# Patient Record
Sex: Female | Born: 1965 | Race: White | Hispanic: No | Smoking: Never smoker
Health system: Southern US, Community
[De-identification: ages and names within clinical notes are randomized; demographics above are authoritative.]

## PROBLEM LIST (undated history)

## (undated) DIAGNOSIS — Z9889 Other specified postprocedural states: Secondary | ICD-10-CM

## (undated) DIAGNOSIS — R112 Nausea with vomiting, unspecified: Secondary | ICD-10-CM

## (undated) DIAGNOSIS — M1990 Unspecified osteoarthritis, unspecified site: Secondary | ICD-10-CM

## (undated) DIAGNOSIS — K635 Polyp of colon: Secondary | ICD-10-CM

## (undated) DIAGNOSIS — C801 Malignant (primary) neoplasm, unspecified: Secondary | ICD-10-CM

## (undated) DIAGNOSIS — G43829 Menstrual migraine, not intractable, without status migrainosus: Secondary | ICD-10-CM

## (undated) DIAGNOSIS — R011 Cardiac murmur, unspecified: Secondary | ICD-10-CM

## (undated) DIAGNOSIS — F988 Other specified behavioral and emotional disorders with onset usually occurring in childhood and adolescence: Secondary | ICD-10-CM

## (undated) DIAGNOSIS — T7840XA Allergy, unspecified, initial encounter: Secondary | ICD-10-CM

## (undated) DIAGNOSIS — F32A Depression, unspecified: Secondary | ICD-10-CM

## (undated) DIAGNOSIS — F419 Anxiety disorder, unspecified: Secondary | ICD-10-CM

## (undated) HISTORY — DX: Cardiac murmur, unspecified: R01.1

## (undated) HISTORY — DX: Malignant (primary) neoplasm, unspecified: C80.1

## (undated) HISTORY — DX: Depression, unspecified: F32.A

## (undated) HISTORY — DX: Unspecified osteoarthritis, unspecified site: M19.90

## (undated) HISTORY — PX: APPENDECTOMY: SHX54

## (undated) HISTORY — PX: HERNIA REPAIR: SHX51

## (undated) HISTORY — DX: Menstrual migraine, not intractable, without status migrainosus: G43.829

## (undated) HISTORY — DX: Anxiety disorder, unspecified: F41.9

## (undated) HISTORY — DX: Allergy, unspecified, initial encounter: T78.40XA

## (undated) HISTORY — DX: Other specified behavioral and emotional disorders with onset usually occurring in childhood and adolescence: F98.8

## (undated) HISTORY — PX: UMBILICAL HERNIA REPAIR: SHX196

## (undated) HISTORY — PX: BREAST LUMPECTOMY: SHX2

## (undated) HISTORY — DX: Polyp of colon: K63.5

---

## 1998-11-01 ENCOUNTER — Other Ambulatory Visit: Admission: RE | Admit: 1998-11-01 | Discharge: 1998-11-01 | Payer: Self-pay | Admitting: Gynecology

## 1999-11-03 ENCOUNTER — Other Ambulatory Visit: Admission: RE | Admit: 1999-11-03 | Discharge: 1999-11-03 | Payer: Self-pay | Admitting: Gynecology

## 2001-01-14 ENCOUNTER — Encounter (INDEPENDENT_AMBULATORY_CARE_PROVIDER_SITE_OTHER): Payer: Self-pay | Admitting: Specialist

## 2001-01-14 ENCOUNTER — Ambulatory Visit (HOSPITAL_COMMUNITY): Admission: RE | Admit: 2001-01-14 | Discharge: 2001-01-14 | Payer: Self-pay | Admitting: General Surgery

## 2001-02-17 ENCOUNTER — Other Ambulatory Visit: Admission: RE | Admit: 2001-02-17 | Discharge: 2001-02-17 | Payer: Self-pay | Admitting: Gynecology

## 2001-04-27 ENCOUNTER — Encounter (INDEPENDENT_AMBULATORY_CARE_PROVIDER_SITE_OTHER): Payer: Self-pay

## 2001-04-27 ENCOUNTER — Other Ambulatory Visit: Admission: RE | Admit: 2001-04-27 | Discharge: 2001-04-27 | Payer: Self-pay | Admitting: Gynecology

## 2001-07-15 ENCOUNTER — Ambulatory Visit (HOSPITAL_COMMUNITY): Admission: RE | Admit: 2001-07-15 | Discharge: 2001-07-15 | Payer: Self-pay | Admitting: Gynecology

## 2001-07-15 ENCOUNTER — Encounter (INDEPENDENT_AMBULATORY_CARE_PROVIDER_SITE_OTHER): Payer: Self-pay

## 2001-07-24 HISTORY — PX: OTHER SURGICAL HISTORY: SHX169

## 2002-04-11 ENCOUNTER — Other Ambulatory Visit: Admission: RE | Admit: 2002-04-11 | Discharge: 2002-04-11 | Payer: Self-pay | Admitting: Gynecology

## 2003-04-20 ENCOUNTER — Other Ambulatory Visit: Admission: RE | Admit: 2003-04-20 | Discharge: 2003-04-20 | Payer: Self-pay | Admitting: Gynecology

## 2004-05-29 ENCOUNTER — Other Ambulatory Visit: Admission: RE | Admit: 2004-05-29 | Discharge: 2004-05-29 | Payer: Self-pay | Admitting: Gynecology

## 2004-08-22 ENCOUNTER — Encounter: Admission: RE | Admit: 2004-08-22 | Discharge: 2004-08-22 | Payer: Self-pay | Admitting: Gynecology

## 2005-04-01 ENCOUNTER — Ambulatory Visit: Payer: Self-pay | Admitting: Licensed Clinical Social Worker

## 2005-04-10 ENCOUNTER — Ambulatory Visit: Payer: Self-pay | Admitting: Licensed Clinical Social Worker

## 2005-04-17 ENCOUNTER — Ambulatory Visit: Payer: Self-pay | Admitting: Licensed Clinical Social Worker

## 2005-04-24 ENCOUNTER — Ambulatory Visit: Payer: Self-pay | Admitting: Licensed Clinical Social Worker

## 2005-05-01 ENCOUNTER — Ambulatory Visit: Payer: Self-pay | Admitting: Licensed Clinical Social Worker

## 2005-05-15 ENCOUNTER — Ambulatory Visit: Payer: Self-pay | Admitting: Licensed Clinical Social Worker

## 2005-05-22 ENCOUNTER — Ambulatory Visit: Payer: Self-pay | Admitting: Licensed Clinical Social Worker

## 2005-06-02 ENCOUNTER — Other Ambulatory Visit: Admission: RE | Admit: 2005-06-02 | Discharge: 2005-06-02 | Payer: Self-pay | Admitting: Gynecology

## 2005-06-12 ENCOUNTER — Ambulatory Visit: Payer: Self-pay | Admitting: Licensed Clinical Social Worker

## 2005-06-19 ENCOUNTER — Ambulatory Visit: Payer: Self-pay | Admitting: Licensed Clinical Social Worker

## 2005-06-26 ENCOUNTER — Ambulatory Visit: Payer: Self-pay | Admitting: Licensed Clinical Social Worker

## 2005-07-17 ENCOUNTER — Ambulatory Visit: Payer: Self-pay | Admitting: Licensed Clinical Social Worker

## 2005-07-24 ENCOUNTER — Ambulatory Visit: Payer: Self-pay | Admitting: Licensed Clinical Social Worker

## 2005-07-31 ENCOUNTER — Ambulatory Visit: Payer: Self-pay | Admitting: Licensed Clinical Social Worker

## 2005-08-07 ENCOUNTER — Ambulatory Visit: Payer: Self-pay | Admitting: Licensed Clinical Social Worker

## 2005-08-14 ENCOUNTER — Ambulatory Visit: Payer: Self-pay | Admitting: Licensed Clinical Social Worker

## 2005-08-21 ENCOUNTER — Ambulatory Visit: Payer: Self-pay | Admitting: Licensed Clinical Social Worker

## 2005-08-28 ENCOUNTER — Ambulatory Visit: Payer: Self-pay | Admitting: Licensed Clinical Social Worker

## 2005-09-02 ENCOUNTER — Ambulatory Visit: Payer: Self-pay | Admitting: Licensed Clinical Social Worker

## 2005-09-18 ENCOUNTER — Ambulatory Visit: Payer: Self-pay | Admitting: Licensed Clinical Social Worker

## 2005-10-09 ENCOUNTER — Ambulatory Visit: Payer: Self-pay | Admitting: Licensed Clinical Social Worker

## 2005-10-23 ENCOUNTER — Ambulatory Visit: Payer: Self-pay | Admitting: Licensed Clinical Social Worker

## 2005-11-06 ENCOUNTER — Ambulatory Visit: Payer: Self-pay | Admitting: Licensed Clinical Social Worker

## 2006-08-31 ENCOUNTER — Other Ambulatory Visit: Admission: RE | Admit: 2006-08-31 | Discharge: 2006-08-31 | Payer: Self-pay | Admitting: Gynecology

## 2008-05-14 ENCOUNTER — Other Ambulatory Visit: Admission: RE | Admit: 2008-05-14 | Discharge: 2008-05-14 | Payer: Self-pay | Admitting: Gynecology

## 2010-06-09 ENCOUNTER — Other Ambulatory Visit: Admission: RE | Admit: 2010-06-09 | Discharge: 2010-06-09 | Payer: Self-pay | Admitting: Gynecology

## 2010-06-09 ENCOUNTER — Ambulatory Visit: Payer: Self-pay | Admitting: Gynecology

## 2010-08-05 ENCOUNTER — Ambulatory Visit: Payer: Self-pay | Admitting: Gynecology

## 2010-12-14 ENCOUNTER — Encounter: Payer: Self-pay | Admitting: Gynecology

## 2011-04-10 NOTE — H&P (Signed)
[facility]  Patient:    Renee Ochoa, Renee Ochoa Visit Number: [id number] MRN: [id number]          Service Type: Attending:  Gaetano Hawthorne. Lily Peer, M.D. Adm. Date:  07/15/01                           History and Physical  SCHEDULED FOR SURGERY:  7:30 a.m. [facility].  BRIEF HISTORY:  The patient is a 45 year old gravida 2, para 2, who was seen in the office several months ago at the time of her annual exam back on February 17, 2001, at which time she was complaining of hirsutism and she was evaluated with the possibility of late onset adrenal hyperplasia.  She had 17 hydroxyprogesterone, DHEA ______ testosterone which were all normal.  An ultrasound had been done to evaluate her ovaries in the event that was potential for a cyst and that could be producing some of the testosterone and causing some of these facial and extremity hair that she had been complaining of.   On May of this year, her ultrasound had a small echo-free cyst measuring 16 x 15 x 16 mm on the right ovary, the left ovary was normal.  The cul-de-sac was negative and the uterus was normal size.  She returned back and had a repeat scan on April 26, 2001, at which time there was a questionable echogenic focus in side the intrauterine cavity and we proceeded with a sonohistogram.  Her right ovarian cyst had completely resolved and now she had a small 15 x 13 simple echo-free cyst on the left.  The sonohistogram had demonstrated 2 echogenic defects measuring 10 x 7 mm and 5 x 3 mm on the posterior wall consistent with possible endometrial polyp.  She had an endometrial biopsy on April 26, 2001, which demonstrated a benign mixed endocervical/endometrial type polyp that had mixed with proliferative endometrium associated with focal degenerative changes consistent with anovulatory bleeding.  Her husband also has had a previous vasectomy.  The patient was given treatment option and the  recommendation was to proceed with resectascopic polypectomy and an endometrial ablation which she is content with.  PAST MEDICAL HISTORY:  She has had a umbilical herniorrhaphy in the past.  She has had a right lumpectomy and it was benign.  MEDICATIONS: 1. Claritin. 2. Benadryl. 3. Tylenol p.r.n.  She has had a history of juvenile arthritis.  She has had 2 normal spontaneous vaginal deliveries.  She has had gestational diabetes during her pregnancy.  FAMILY HISTORY:  Significant for the fact that her father had hypertension as well as her grandfather and her grandmother had breast cancer.  The patients recent Pap smear in March of this year was normal.  PHYSICAL EXAMINATION:  HEENT:  Unremarkable.  NECK:  Supple.  Trachea midline.  No carotid bruits.  No thyromegaly.  LUNGS:  Clear to auscultation without rhonchis or wheezes.  HEART:  Regular rate and rhythm, no murmurs or gallops.  BREAST:  Done at time of her annual exam was essentially unremarkable.  ABDOMEN:  Soft, nontender without rebound or guarding.  PELVIC:  Bartholin, Skene, urethra was within normal limits.  VAGINA AND CERVIX:  No lesion or discharge.  Uterus anteverted.  Normal size, shape and consistency.  ADNEXA:  Without masses or tenderness.  RECTAL EXAM:  Unremarkable.  ASSESSMENT: 18. A 45 year old gravida 2, para 2 with history of menorrhagia and a endometrial polyps recently found on sonohistogram  and also on endometrial biopsy.  Would like to proceed with definitive surgery but not as an invasive operation such as a hysterectomy.  She had received Lupron 11.25 mg IM on May 03, 2001, and is no longer bleeding.  We will proceed with a resectascopic polypectomy and endometrial ablation.  She received Laminaria that was placed transcervically today in the office in an effort to facilitate the insertion of the operative hysteroscope tomorrow in the operating room.  The risks, benefits, pros and  cons of the operation to include infection, bleeding, fluid overload, trauma to nearby organs were discussed with the patient and all questions were answered.  She will receive a prophylactic antibiotic as well. She was given a prescription for Lortab 7.5/500 1 p.o. q.4-6h. p.r.n. pain. All questions were answered and will follow accordingly.  PLAN: The patient is scheduled for resectascopic polypectomy/endometrial ablation tomorrow July 15, 2001, at [facility]. Attending:  Gaetano Hawthorne. Lily Peer, M.D. DD:  07/14/01 TD:  07/14/01 Job: [id number]

## 2011-04-10 NOTE — Op Note (Signed)
[facility]  Patient:    Renee Ochoa, Renee Ochoa                      MRN: [id number] Proc. Date: 01/14/01 Adm. Date:  [id number] Disc. Date: [id number] Attending:  Carson Myrtle                           Operative Report  PREOPERATIVE DIAGNOSIS:  Mass, right breast.  POSTOPERATIVE DIAGNOSIS:  Mass, right breast.  PROCEDURE:  Excisional biopsy of mass in right breast.  SURGEON:  Timothy E. Earlene Plater, M.D.  ANESTHESIA:  Local standby.  HISTORY:  [name] is a 45 year old Caucasian female with a strong family history of carcinoma of the breast and a new, slightly painful mass in the lateral right breast.  She wishes to have an excisional biopsy, as compared to other procedures.  She and I have located the mass and marked it preoperatively.  DESCRIPTION OF PROCEDURE:  The patient was taken to the operating room and placed supine, right chest elevated, arms outstretched and carefully padded. IV sedation given.  The mass was palpable at the lateral edge of the right breast, in approximately the mid axillary line; it was palpable.  The area was prepped and draped in the usual fashion.  Marcaine 0.25% with epinephrine was used throughout for local anesthesia.  A curvilinear incision made in the skin crease over the mass, the subcutaneous tissue dissected; the mass was seen and then grasped, and bluntly dissected from the surrounding tissue.  It had the appearance of either a benign lymph node or fibroadenoma.  It was submitted routinely for pathology.  Bleeding was carefully controlled and wound was dry. It was approximated deep with 3-0 Vicryl and the skin with 4-0 Monocryl. Steri-Strips were applied.  Counts were correct.  She tolerated it well and was removed to recovery room in good condition.  DISPOSITION:  Written and verbal instructions were given to the patient and her husband.  She will be seen and followed as an outpatient. DD:  01/14/01 TD:   01/17/01 Job: [id number]

## 2011-04-10 NOTE — Op Note (Signed)
[facility]  Patient:    Renee Ochoa, Renee Ochoa Visit Number: [id number] MRN: [id number]          Service Type: DSU Location: [facility] Attending Physician:  Tonye Royalty Proc. Date: 07/15/01 Adm. Date:  07/15/2001 Disc. Date: 07/15/2001                             Operative Report  INDICATIONS:                  A 45 year old gravida 2, para 2 with menomenorrhagia.  Workup has consisted of a sonohysterogram which demonstrated the patient had endometrial polyps.  Endometrial biopsy had demonstrated benign endometrial polyps and benign endometrium.  Patient previously primed with Lupron 11.25 mg IM.  Patients husband with previous vasectomy.  Patient with two children in the past and wanted to proceed with resectoscopic polypectomy and endometrial ablation.  PREOPERATIVE DIAGNOSES:       1. Menomenorrhagia.                               2. Endometrial polyps.  POSTOPERATIVE DIAGNOSES:      1. Menomenorrhagia.                               2. Endometrial polyps.  PROCEDURE:                    ______.  SURGEON:                      Juan H. Lily Peer, M.D.  ANESTHESIA:                   General endotracheal.  PROCEDURE:                    1. Resectoscopic polypectomy.                               2. Endomyo resection.                               3. Endometrial ablation.  FINDINGS:                     Endometrial polyp noted near the left tubal ostia.  The rest of the uterine cavity and the cervical canal were clear.  DESCRIPTION OF PROCEDURE:     After the patient was adequately counseled she was taken to the operating room where she underwent a successful general endotracheal anesthesia.  She was placed in the low lithotomy position.  She had received 1 g Cefotan prophylactically before the procedure.  Her bladder was evacuated of its contents of approximately 50-75 cc.  Examination under anesthesia confirmed upper limits of normal,  slightly anteverted uterus with no palpable adnexal masses.  The previously placed ______ tent that was placed in the office the day before was removed requiring minimal cervical dilatation with the [facility] dilator.  The operative resectoscope with a 90% degree wire loop was utilized and the [facility] generator was set at 80 watts in the cutting mode, 80 watts on the coagulation mode. The distending media was chilled 3% sorbitol.  The  operative resectoscope was inserted into the interior uterine cavity and the polyp with the stalk coming off near the left tubal ostia was resected and passed off the operative field. Then the entire intrauterine cavity was resected down to the level of the myometrium and the strips of tissue were submitted for histological evaluation.  The operative element was switched from a 90 degree wire loop to a loller bar in which the entire endometrial cavity was then ablated to the level of the internal cervical os.  The procedure was tolerated well by the patient.  Fluid deficit from the 3% distending media of the sorbitol was 300 cc and IV fluids were 1300 cc of lactated Ringers.  Patient was extubated, transferred to recovery room with stable vital signs.  Pictures were taken before and after the procedure.  Will be made part of the patients hospital as well as the office permanent record.  Patient received 30 mg of Toradol en route to the recovery room as well as 4 mg of Zofran for nausea IV. Attending Physician:  Tonye Royalty DD:  07/15/01 TD:  07/17/01 Job: [id number]

## 2012-05-03 ENCOUNTER — Other Ambulatory Visit: Payer: Self-pay | Admitting: *Deleted

## 2012-05-03 DIAGNOSIS — R928 Other abnormal and inconclusive findings on diagnostic imaging of breast: Secondary | ICD-10-CM

## 2012-05-04 ENCOUNTER — Other Ambulatory Visit: Payer: Self-pay | Admitting: Gynecology

## 2012-05-04 DIAGNOSIS — R928 Other abnormal and inconclusive findings on diagnostic imaging of breast: Secondary | ICD-10-CM

## 2012-05-09 ENCOUNTER — Encounter: Payer: Self-pay | Admitting: Gynecology

## 2013-02-27 ENCOUNTER — Encounter: Payer: Self-pay | Admitting: Gynecology

## 2013-02-27 ENCOUNTER — Ambulatory Visit (INDEPENDENT_AMBULATORY_CARE_PROVIDER_SITE_OTHER): Payer: Managed Care, Other (non HMO) | Admitting: Gynecology

## 2013-02-27 ENCOUNTER — Other Ambulatory Visit (HOSPITAL_COMMUNITY)
Admission: RE | Admit: 2013-02-27 | Discharge: 2013-02-27 | Disposition: A | Discharge status: Home / Self Care | Payer: 59 | Source: Ambulatory Visit | Attending: Gynecology | Admitting: Gynecology

## 2013-02-27 VITALS — BP 128/70 | Ht 60.75 in | Wt 129.0 lb

## 2013-02-27 DIAGNOSIS — Z1151 Encounter for screening for human papillomavirus (HPV): Secondary | ICD-10-CM | POA: Insufficient documentation

## 2013-02-27 DIAGNOSIS — Z01419 Encounter for gynecological examination (general) (routine) without abnormal findings: Secondary | ICD-10-CM | POA: Insufficient documentation

## 2013-02-27 NOTE — Patient Instructions (Addendum)
Breast Self-Awareness  Practicing breast self-awareness may pick up problems early, prevent significant medical complications, and possibly save your life. By practicing breast self-awareness, you can become familiar with how your breasts look and feel and if your breasts are changing. This allows you to notice changes early. It can also offer you some reassurance that your breast health is good. One way to learn what is normal for your breasts and whether your breasts are changing is to do a breast self-exam.  If you find a lump or something that was not present in the past, it is best to contact your caregiver right away. Other findings that should be evaluated by your caregiver include nipple discharge, especially if it is bloody; skin changes or reddening; areas where the skin seems to be pulled in (retracted); or new lumps and bumps. Breast pain is seldom associated with cancer (malignancy), but should also be evaluated by a caregiver.  BREAST SELF-EXAM  The best time to examine your breasts is 5 7 days after your menstrual period is over. During menstruation, the breasts are lumpier, and it may be more difficult to pick up changes. If you do not menstruate, have reached menopause, or had your uterus removed (hysterectomy), you should examine your breasts at regular intervals, such as monthly. If you are breastfeeding, examine your breasts after a feeding or after using a breast pump. Breast implants do not decrease the risk for lumps or tumors, so continue to perform breast self-exams as recommended. Talk to your caregiver about how to determine the difference between the implant and breast tissue. Also, talk about the amount of pressure you should use during the exam. Over time, you will become more familiar with the variations of your breasts and more comfortable with the exam. A breast self-exam requires you to remove all your clothes above the waist.    Look at your breasts and nipples. Stand in front of  a mirror in a room with good lighting. With your hands on your hips, push your hands firmly downward. Look for a difference in shape, contour, and size from one breast to the other (asymmetry). Asymmetry includes puckers, dips, or bumps. Also, look for skin changes, such as reddened or scaly areas on the breasts. Look for nipple changes, such as discharge, dimpling, repositioning, or redness.   Carefully feel your breasts. This is best done either in the shower or tub while using soapy water or when flat on your back. Place the arm (on the side of the breast you are examining) above your head. Use the pads (not the fingertips) of your three middle fingers on your opposite hand to feel your breasts. Start in the underarm area and use  inch (2 cm) overlapping circles to feel your breast. Use 3 different levels of pressure (light, medium, and firm pressure) at each circle before moving to the next circle. The light pressure is needed to feel the tissue closest to the skin. The medium pressure will help to feel breast tissue a little deeper, while the firm pressure is needed to feel the tissue close to the ribs. Continue the overlapping circles, moving downward over the breast until you feel your ribs below your breast. Then, move one finger-width towards the center of the body. Continue to use the  inch (2 cm) overlapping circles to feel your breast as you move slowly up toward the collar bone (clavicle) near the base of the neck. Continue the up and down exam using all 3 pressures   until you reach the middle of the chest. Do this with each breast, carefully feeling for lumps or changes.   Keep a written record with breast changes or normal findings for each breast. By writing this information down, you do not need to depend only on memory for size, tenderness, or location. Write down where you are in your menstrual cycle, if you are still menstruating.   Breast tissue can have some lumps or thick tissue. However,  see your caregiver if you find anything that concerns you.   SEEK MEDICAL CARE IF:   You see a change in shape, contour, or size of your breasts or nipples.    You see skin changes, such as reddened or scaly areas on the breasts or nipples.    You have an unusual discharge from your nipples.    You feel a new lump or unusually thick areas.   Document Released: 11/09/2005 Document Revised: 05/10/2012 Document Reviewed: 02/24/2012  Exit[employer] Patient Information 2013 [employer].

## 2013-02-28 ENCOUNTER — Encounter: Payer: Self-pay | Admitting: Gynecology

## 2013-02-28 LAB — CBC WITH DIFFERENTIAL/PLATELET
Eosinophils Absolute: 0.4 10*3/uL (ref 0.0–0.7)
Eosinophils Relative: 5 % (ref 0–5)
HCT: 39.4 % (ref 36.0–46.0)
Lymphocytes Relative: 33 % (ref 12–46)
Lymphs Abs: 2.8 10*3/uL (ref 0.7–4.0)
MCH: 29.1 pg (ref 26.0–34.0)
MCV: 84.2 fL (ref 78.0–100.0)
Monocytes Absolute: 1 10*3/uL (ref 0.1–1.0)
Monocytes Relative: 12 % (ref 3–12)
Platelets: 272 10*3/uL (ref 150–400)
RBC: 4.68 MIL/uL (ref 3.87–5.11)

## 2013-02-28 LAB — URINALYSIS W MICROSCOPIC + REFLEX CULTURE
Casts: NONE SEEN
Crystals: NONE SEEN
Glucose, UA: NEGATIVE mg/dL
Leukocytes, UA: NEGATIVE
Specific Gravity, Urine: 1.009 (ref 1.005–1.030)
Squamous Epithelial / LPF: NONE SEEN
pH: 6.5 (ref 5.0–8.0)

## 2013-02-28 NOTE — Progress Notes (Addendum)
Renee Ochoa [birth year] [id number]   History:    47 y.o.  for annual gyn exam with no complaints today. Review of patient's records indicated that back in 1993 she had an abnormal Pap smear with slight dysplasia and had cryotherapy and since then her Pap smears have been normal. Patient with prior history of resectoscopic polypectomy as well as endometrial ablation back in 1998 patient cycles of been regular. 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 She states her Tdap vaccine are up-to-date. Patient divorced and not sexually active. Prior to endometrial ablation since she had been married at that time her husband had a vasectomy. Patient has had history of gestational diabetes in the past.  Patient was evaluated in 2012 by cardiologist Dr. Herbie Baltimore for possible mitral valve prolapse and past history of palpitations. He has stated that she had a reassuring echocardiogram without any real significant mitral valve prolapse. He describes her as having a mild murmur, mild regurgitation and recommended a followup echocardiogram in 3-4 years to make sure that there was no progression.  Patient had a right breast biopsy in 2002 by Lorelee New M.D. Pathology report came back a lymph node benign. Grossly and and and a and and and a the the a the  Past medical history,surgical history, family history and social history were all reviewed and documented in the EPIC chart.  Gynecologic History Patient's last menstrual period was 02/06/2013. Contraception: none Last Pap: 2011. Results were: normal Last mammogram: 2013. Results were: normal  Obstetric History OB History   Grav Para Term Preterm Abortions TAB SAB Ect Mult Living   2 2        2      # Outc Date GA Lbr Len/2nd Wgt Sex Del Anes PTL Lv   1 PAR            2 PAR                ROS: A ROS was performed and pertinent positives and negatives are included in the history.  GENERAL: No  fevers or chills. HEENT: No change in vision, no earache, sore throat or sinus congestion. NECK: No pain or stiffness. CARDIOVASCULAR: No chest pain or pressure. No palpitations. PULMONARY: No shortness of breath, cough or wheeze. GASTROINTESTINAL: No abdominal pain, nausea, vomiting or diarrhea, melena or bright red blood per rectum. GENITOURINARY: No urinary frequency, urgency, hesitancy or dysuria. MUSCULOSKELETAL: No joint or muscle pain, no back pain, no recent trauma. DERMATOLOGIC: No rash, no itching, no lesions. ENDOCRINE: No polyuria, polydipsia, no heat or cold intolerance. No recent change in weight. HEMATOLOGICAL: No anemia or easy bruising or bleeding. NEUROLOGIC: No headache, seizures, numbness, tingling or weakness. PSYCHIATRIC: No depression, no loss of interest in normal activity or change in sleep pattern.     Exam: chaperone present  BP 128/70  Ht 5' 0.75" (1.543 m)  Wt 129 lb (58.514 kg)  BMI 24.58 kg/m2  LMP 02/06/2013  Body mass index is 24.58 kg/(m^2).  General appearance : Well developed well nourished female. No acute distress HEENT: Neck supple, trachea midline, no carotid bruits, no thyroidmegaly Lungs: Clear to auscultation, no rhonchi or wheezes, or rib retractions  Heart: Regular rate and rhythm, no murmurs or gallops Breast:Examined in sitting and supine position were symmetrical in appearance, no palpable masses or tenderness,  no skin retraction, no nipple inversion, no nipple discharge, no skin discoloration, no axillary or supraclavicular lymphadenopathy Abdomen: no palpable  masses or tenderness, no rebound or guarding Extremities: no edema or skin discoloration or tenderness  Pelvic:  Bartholin, Urethra, Skene Glands: Within normal limits             Vagina: No gross lesions or discharge  Cervix: No gross lesions or discharge  Uterus  anteverted, normal size, shape and consistency, non-tender and mobile  Adnexa  Without masses or tenderness  Anus and  perineum  normal   Rectovaginal  normal sphincter tone without palpated masses or tenderness             Hemoccult not indicated     Assessment/Plan:  47 y.o. female for annual exam with no complaints today. Discussed importance of monthly self breast examination. We also discussed importance of calcium vitamin D and regular exercise for osteoporosis prevention. Patient will be scheduling her mammogram for June of this year. Her Pap smear was done today. The new guidelines were discussed today. The following labs were ordered: CBC, hemoglobin A1c, screening cholesterol, as well as urinalysis.    Ok Edwards MD, 7:49 AM 02/28/2013

## 2013-03-15 ENCOUNTER — Encounter: Payer: Self-pay | Admitting: Gynecology

## 2013-05-23 ENCOUNTER — Encounter: Payer: Self-pay | Admitting: Gynecology

## 2013-09-28 ENCOUNTER — Other Ambulatory Visit: Payer: Self-pay

## 2013-10-22 ENCOUNTER — Encounter (HOSPITAL_COMMUNITY): Payer: Self-pay | Admitting: Emergency Medicine

## 2013-10-22 ENCOUNTER — Emergency Department (HOSPITAL_COMMUNITY): Payer: Managed Care, Other (non HMO)

## 2013-10-22 ENCOUNTER — Emergency Department (HOSPITAL_COMMUNITY)
Admission: EM | Admit: 2013-10-22 | Discharge: 2013-10-22 | Disposition: A | Discharge status: Nursing Home (Includes ICF) | Payer: 59 | Source: Home / Self Care | Attending: Family Medicine | Admitting: Family Medicine

## 2013-10-22 ENCOUNTER — Observation Stay (HOSPITAL_COMMUNITY)
Admission: EM | Admit: 2013-10-22 | Discharge: 2013-10-24 | Disposition: A | Discharge status: Home / Self Care | Payer: Managed Care, Other (non HMO) | Attending: General Surgery | Admitting: General Surgery

## 2013-10-22 DIAGNOSIS — K37 Unspecified appendicitis: Secondary | ICD-10-CM | POA: Diagnosis present

## 2013-10-22 DIAGNOSIS — K3580 Unspecified acute appendicitis: Principal | ICD-10-CM | POA: Insufficient documentation

## 2013-10-22 DIAGNOSIS — D251 Intramural leiomyoma of uterus: Secondary | ICD-10-CM | POA: Insufficient documentation

## 2013-10-22 DIAGNOSIS — R51 Headache: Secondary | ICD-10-CM | POA: Insufficient documentation

## 2013-10-22 DIAGNOSIS — R52 Pain, unspecified: Secondary | ICD-10-CM

## 2013-10-22 DIAGNOSIS — R1031 Right lower quadrant pain: Secondary | ICD-10-CM

## 2013-10-22 HISTORY — DX: Other specified postprocedural states: Z98.890

## 2013-10-22 HISTORY — DX: Nausea with vomiting, unspecified: R11.2

## 2013-10-22 LAB — WET PREP, GENITAL
Clue Cells Wet Prep HPF POC: NONE SEEN
Trich, Wet Prep: NONE SEEN
Yeast Wet Prep HPF POC: NONE SEEN

## 2013-10-22 LAB — POCT URINALYSIS DIP (DEVICE)
Bilirubin Urine: NEGATIVE
Hgb urine dipstick: NEGATIVE
Ketones, ur: NEGATIVE mg/dL
Leukocytes, UA: NEGATIVE
Protein, ur: NEGATIVE mg/dL
Specific Gravity, Urine: 1.01 (ref 1.005–1.030)
pH: 7 (ref 5.0–8.0)

## 2013-10-22 LAB — COMPREHENSIVE METABOLIC PANEL
AST: 16 U/L (ref 0–37)
Albumin: 3.7 g/dL (ref 3.5–5.2)
Alkaline Phosphatase: 50 U/L (ref 39–117)
BUN: 6 mg/dL (ref 6–23)
Chloride: 102 mEq/L (ref 96–112)
Creatinine, Ser: 0.6 mg/dL (ref 0.50–1.10)
GFR calc Af Amer: >90 mL/min (ref >90)
GFR calc non Af Amer: >90 mL/min (ref >90)
Glucose, Bld: 94 mg/dL (ref 70–99)
Potassium: 4 mEq/L (ref 3.5–5.1)
Total Bilirubin: 0.4 mg/dL (ref 0.3–1.2)

## 2013-10-22 LAB — CBC WITH DIFFERENTIAL/PLATELET
Basophils Absolute: 0 10*3/uL (ref 0.0–0.1)
Eosinophils Relative: 4 % (ref 0–5)
Lymphocytes Relative: 17 % (ref 12–46)
Lymphs Abs: 2 10*3/uL (ref 0.7–4.0)
MCV: 84.4 fL (ref 78.0–100.0)
Neutro Abs: 8.1 10*3/uL — ABNORMAL HIGH (ref 1.7–7.7)
Neutrophils Relative %: 70 % (ref 43–77)
Platelets: 249 10*3/uL (ref 150–400)
RBC: 4.81 MIL/uL (ref 3.87–5.11)
RDW: 13.1 % (ref 11.5–15.5)
WBC: 11.7 10*3/uL — ABNORMAL HIGH (ref 4.0–10.5)

## 2013-10-22 LAB — POCT PREGNANCY, URINE: Preg Test, Ur: NEGATIVE

## 2013-10-22 MED ORDER — IOHEXOL 300 MG/ML  SOLN
20.0000 mL | INTRAMUSCULAR | Status: AC
  Administered 2013-10-22: 25 mL via ORAL

## 2013-10-22 MED ORDER — ONDANSETRON HCL 4 MG/2ML IJ SOLN
4.0000 mg | Freq: Once | INTRAMUSCULAR | Status: DC
  Administered 2013-10-23: 4 mg via INTRAVENOUS

## 2013-10-22 MED ORDER — ONDANSETRON HCL 4 MG/2ML IJ SOLN: 4.0000 mg | Freq: Four times a day (QID) | INTRAMUSCULAR | Status: DC | PRN

## 2013-10-22 MED ORDER — SODIUM CHLORIDE 0.9 % IV SOLN
INTRAVENOUS | Status: DC
  Administered 2013-10-22: 23:00:00 via INTRAVENOUS

## 2013-10-22 MED ORDER — IOHEXOL 300 MG/ML  SOLN
100.0000 mL | Freq: Once | INTRAMUSCULAR | Status: AC | PRN
  Administered 2013-10-22: 100 mL via INTRAVENOUS

## 2013-10-22 MED ORDER — MORPHINE SULFATE 2 MG/ML IJ SOLN: 2.0000 mg | INTRAMUSCULAR | Status: DC | PRN

## 2013-10-22 MED ORDER — SODIUM CHLORIDE 0.9 % IV SOLN
1.0000 g | INTRAVENOUS | Status: DC
  Administered 2013-10-22: 1 g via INTRAVENOUS
  Filled 2013-10-22: qty 1

## 2013-10-22 MED ORDER — MORPHINE SULFATE 4 MG/ML IJ SOLN: 4.0000 mg | Freq: Once | INTRAMUSCULAR | Status: DC

## 2013-10-22 MED ORDER — IBUPROFEN 800 MG PO TABS
800.0000 mg | ORAL_TABLET | Freq: Once | ORAL | Status: AC
  Administered 2013-10-22: 800 mg via ORAL
  Filled 2013-10-22: qty 1

## 2013-10-22 NOTE — ED Notes (Signed)
Ct notified pt completed contrast.  

## 2013-10-22 NOTE — ED Notes (Signed)
PT requesting PO pain meds . Instead of IV meds.

## 2013-10-22 NOTE — ED Notes (Signed)
Pt reports RLQ and pelvic pain since Saturday. She went to [facility] today and they sent to ed for further workup. Has been taking ibuprofen with some relief

## 2013-10-22 NOTE — ED Provider Notes (Signed)
CSN: [id number]     Arrival date & time 10/22/13  1148 History   First MD Initiated Contact with Patient 10/22/13 1412     Chief Complaint  Patient presents with  . Pelvic Pain    Patient is a 47 y.o. female presenting with abdominal pain. The history is provided by the patient.  Abdominal Pain This is a new problem. The current episode started 12 to 24 hours ago. The problem occurs constantly. The problem has been gradually worsening. Associated symptoms include abdominal pain. The symptoms are aggravated by walking and bending. The symptoms are relieved by NSAIDs.  Pt with constant RLQ abd pain since approx 0300 this am.  She has had transient nausea but no V/D. Denies fever. Reports she has had ovarian cysts in past but this feels very different. Pt states has not been sexually active in over a year. Normal period 2 weeks ago. Some slight increased thick white vag d/c w/o odor. Small BM yesterday. Pt states w/o Ibuprofen RLQ pain is 8-9/10 and she can only get comfortable in knee chest position. Ibuprofen makes the pain bearable at about 4-5/.  Denies UTI like symptoms. Pt admits to h/o uterine ablation in 2002 for tumor removal and umbilical hernia repair as a child. No other abd surgeries. Past Medical History  Diagnosis Date  . Gestational diabetes     H/0  . Arthritis   . ADD (attention deficit disorder)   . Menstrual migraine    Past Surgical History  Procedure Laterality Date  . Umbilical hernia repair    . Breast lumpectomy      RIGHT  . Resectoscopic polypectomy, endomyoresection, endometrial ablation  07/2001   Family History  Problem Relation Age of Onset  . Hypertension Father   . Breast cancer Maternal Grandmother   . Hypertension Paternal Grandfather    History  Substance Use Topics  . Smoking status: Never Smoker   . Smokeless tobacco: Never Used  . Alcohol Use: Yes     Comment: rare   OB History   Grav Para Term Preterm Abortions TAB SAB Ect Mult Living   2  2        2      Review of Systems  Constitutional: Negative.   HENT: Negative.   Eyes: Negative.   Respiratory: Negative.   Cardiovascular: Negative.   Gastrointestinal: Positive for nausea and abdominal pain. Negative for vomiting, diarrhea, constipation and abdominal distention.  Endocrine: Negative.   Genitourinary: Negative.   Musculoskeletal: Negative.   Skin: Negative.   Allergic/Immunologic: Negative.   Neurological: Negative.   Hematological: Negative.   Psychiatric/Behavioral: Negative.     Allergies  Fexofenadine hcl  Home Medications  No current outpatient prescriptions on file. BP 112/76  Pulse 91  Temp(Src) 98.3 F (36.8 C) (Oral)  Resp 18  SpO2 99%  LMP 10/04/2013 Physical Exam  Constitutional: She is oriented to person, place, and time. She appears well-developed and well-nourished.  HENT:  Head: Normocephalic and atraumatic.  Eyes: Conjunctivae are normal.  Cardiovascular: Normal rate and regular rhythm.   Pulmonary/Chest: Effort normal and breath sounds normal.  Abdominal: Soft. Bowel sounds are normal. She exhibits no distension. There is tenderness in the right lower quadrant. There is no rebound. No hernia.  Musculoskeletal: Normal range of motion.  Neurological: She is alert and oriented to person, place, and time.  Skin: Skin is warm and dry.  Psychiatric: She has a normal mood and affect.    ED Course  Procedures (including  critical care time) Labs Review Labs Reviewed  POCT URINALYSIS DIP (DEVICE)  POCT PREGNANCY, URINE   Imaging Review No results found.  EKG Interpretation    Date/Time:    Ventricular Rate:    PR Interval:    QRS Duration:   QT Interval:    QTC Calculation:   R Axis:     Text Interpretation:              MDM  No diagnosis found. Approx 12 hr h/o constant RLQ abd pain that is severe at times. Some nausea but no V/D or fever. Ibuprofen makes pain tolerable but w/o it pain is severe.  On exam pt is  very TTP to RLQ and pain radiates to LLQ. No rebound TTP. Abd is otherwise soft w/ normal bs. Pt is w/o usual constellation of symptoms for acute appendicitis but given pt's impressive TTP over RLQ concerning for appendicitis will send to [facility] for further evaluation. Discussed pt w/ Dr Artis Flock who is in agreement w/ plan.     Leanne Chang, NP 10/22/13 1536

## 2013-10-22 NOTE — ED Notes (Signed)
MD at bedside. 

## 2013-10-22 NOTE — ED Notes (Signed)
Patient transported to CT 

## 2013-10-22 NOTE — ED Notes (Signed)
Transporting patient to new bed assignment. 

## 2013-10-22 NOTE — ED Notes (Signed)
Patient transported to Ultrasound 

## 2013-10-22 NOTE — ED Notes (Signed)
47 yr old is here today with complaints of Pelvic pain - RLQ mostly then radiates to left periodically since yesterday. She states Ibuprofen helps but when it wears off the pain comes back fiercely.  Denies: fever; appendectomy and cholecystectomy.

## 2013-10-22 NOTE — ED Provider Notes (Signed)
CSN: [id number]     Arrival date & time 10/22/13  1506 History   First MD Initiated Contact with Patient 10/22/13 1657     Chief Complaint  Patient presents with  . Abdominal Pain   (Consider location/radiation/quality/duration/timing/severity/associated sxs/prior Treatment) HPI   This a 47 yo female with no significant past medical history who presents with right lower quadrant pain. Patient reports onset of symptoms at 3 AM yesterday morning. They were brought him sleep. Patient reports dull right lower quadrant pain that comes and goes. Currently her pain is 4/10. It is nonradiating. She had nausea without vomiting yesterday. The pain is improved with ibuprofen. Patient has been able to tolerate by mouth intake. She denies any fevers. Patient denies any vaginal discharge. Her last menstrual period was November 12. She denies any new sexual partners and last intercourse was over a year ago.  Past Medical History  Diagnosis Date  . Gestational diabetes     H/0  . Arthritis   . ADD (attention deficit disorder)   . Menstrual migraine   . PONV (postoperative nausea and vomiting)    Past Surgical History  Procedure Laterality Date  . Umbilical hernia repair    . Breast lumpectomy      RIGHT  . Resectoscopic polypectomy, endomyoresection, endometrial ablation  07/2001   Family History  Problem Relation Age of Onset  . Hypertension Father   . Breast cancer Maternal Grandmother   . Hypertension Paternal Grandfather    History  Substance Use Topics  . Smoking status: Never Smoker   . Smokeless tobacco: Never Used  . Alcohol Use: Yes     Comment: rare   OB History   Grav Para Term Preterm Abortions TAB SAB Ect Mult Living   2 2        2      Review of Systems  Constitutional: Negative for fever.  Respiratory: Negative for cough, chest tightness and shortness of breath.   Cardiovascular: Negative for chest pain.  Gastrointestinal: Positive for nausea and abdominal pain.  Negative for vomiting.  Genitourinary: Negative for dysuria, urgency and vaginal discharge.  Musculoskeletal: Negative for back pain.  Skin: Negative for wound.  Neurological: Negative for headaches.  All other systems reviewed and are negative.    Allergies  Fexofenadine hcl  Home Medications   No current outpatient prescriptions on file. BP 129/72  Pulse 108  Temp(Src) 99.3 F (37.4 C) (Oral)  Resp 16  Ht 5\' 2"  (1.575 m)  Wt 137 lb 12.8 oz (62.506 kg)  BMI 25.20 kg/m2  SpO2 99%  LMP 10/04/2013 Physical Exam  Nursing note and vitals reviewed. Constitutional: She is oriented to person, place, and time. She appears well-developed and well-nourished.  HENT:  Head: Normocephalic and atraumatic.  Cardiovascular: Normal rate, regular rhythm and normal heart sounds.   No murmur heard. Pulmonary/Chest: Effort normal and breath sounds normal. No respiratory distress. She has no wheezes.  Abdominal: Soft. Bowel sounds are normal. She exhibits no distension. There is tenderness. There is no rebound and no guarding.  Tenderness palpation of the right lower quadrant without rebound or guarding. No Rovsings.  Neurological: She is alert and oriented to person, place, and time.  Skin: Skin is warm and dry.  Psychiatric: She has a normal mood and affect.    ED Course  Procedures (including critical care time) Labs Review Labs Reviewed  WET PREP, GENITAL - Abnormal; Notable for the following:    WBC, Wet Prep HPF POC FEW (*)  All other components within normal limits  CBC WITH DIFFERENTIAL - Abnormal; Notable for the following:    WBC 11.7 (*)    Neutro Abs 8.1 (*)    All other components within normal limits  GC/CHLAMYDIA PROBE AMP  SURGICAL PCR SCREEN  COMPREHENSIVE METABOLIC PANEL   Imaging Review US Transvaginal Non-ob  10/22/2013   CLINICAL DATA:  Abdominal pain.  Right lower quadrant pain.  EXAM: TRANSABDOMINAL AND TRANSVAGINAL ULTRASOUND OF PELVIS  TECHNIQUE: Both  transabdominal and transvaginal ultrasound examinations of the pelvis were performed. Transabdominal technique was performed for global imaging of the pelvis including uterus, ovaries, adnexal regions, and pelvic cul-de-sac. It was necessary to proceed with endovaginal exam following the transabdominal exam to visualize the adnexa and uterus.  COMPARISON:  None  FINDINGS: Uterus  Measurements: Anteverted and measures 8.3 x 4.2 x 5.5 cm. Globular in shape with diffusely heterogeneous echotexture and linear areas of shadowing. These findings suggest adenomyosis. Small intramural fibroid in the anterior fundal region measures 9 x 8 x 7 mm.  Endometrium  Thickness: 5 mm.  No focal abnormality visualized.  Right ovary  Measurements: 3.8 x 2.1 x 2.6 cm. Normal appearance/no adnexal mass.  Left ovary  Measurements: 2.5 x 1.3 x 1.4 cm. Normal appearance/no adnexal mass.  Other findings  Small amount of free pelvic fluid.  IMPRESSION: 1. No definite explanation for right lower quadrant pain. 2. 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 3. Small anterior fundal fibroid.   Electronically Signed   By: Britta Mccreedy M.D.   On: 10/22/2013 18:57   US Pelvis Complete  10/22/2013   CLINICAL DATA:  Abdominal pain.  Right lower quadrant pain.  EXAM: TRANSABDOMINAL AND TRANSVAGINAL ULTRASOUND OF PELVIS  TECHNIQUE: Both transabdominal and transvaginal ultrasound examinations of the pelvis were performed. Transabdominal technique was performed for global imaging of the pelvis including uterus, ovaries, adnexal regions, and pelvic cul-de-sac. It was necessary to proceed with endovaginal exam following the transabdominal exam to visualize the adnexa and uterus.  COMPARISON:  None  FINDINGS: Uterus  Measurements: Anteverted and measures 8.3 x 4.2 x 5.5 cm. Globular in shape with diffusely heterogeneous echotexture and linear areas of shadowing. These findings suggest adenomyosis. Small intramural fibroid in the anterior fundal region measures 9 x 8  x 7 mm.  Endometrium  Thickness: 5 mm.  No focal abnormality visualized.  Right ovary  Measurements: 3.8 x 2.1 x 2.6 cm. Normal appearance/no adnexal mass.  Left ovary  Measurements: 2.5 x 1.3 x 1.4 cm. Normal appearance/no adnexal mass.  Other findings  Small amount of free pelvic fluid.  IMPRESSION: 1. No definite explanation for right lower quadrant pain. 2. 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 3. Small anterior fundal fibroid.   Electronically Signed   By: Britta Mccreedy M.D.   On: 10/22/2013 18:57   Ct Abdomen Pelvis W Contrast  10/22/2013   *RADIOLOGY REPORT*  Clinical Data: Abdominal pain  CT ABDOMEN AND PELVIS WITH CONTRAST  Technique:  Multidetector CT imaging of the abdomen and pelvis was performed following the standard protocol during bolus administration of intravenous contrast.  Contrast: OMNIPAQUE IOHEXOL 300 MG/ML  SOLN  Comparison: 10/22/2013 ultrasound  Findings: Lung bases are clear.  Normal heart size.  Subcentimeter hypodensity within the left hepatic lobe is nonspecific however favored to reflect a cyst.  Mild low attenuation of the liver parenchyma is nonspecific post contrast however can be seen with fatty infiltration.  No radiodense gallstones, biliary ductal dilatation, or focal splenic or pancreatic abnormality.  Unremarkable adrenal  glands.  Symmetric renal enhancement.  No hydroureteronephrosis.  No CT evidence for colitis. The appendix measures 11 mm in diameter and shows mild mucosal hyperenhancement and very mild periappendiceal fat stranding.  No free intraperitoneal air or loculated fluid collection.  There is free fluid dependently within the pelvis.  Small bowel loops are normal course and caliber.  No lymphadenopathy.  Normal caliber aorta and branch vessels.  Thin-walled bladder.  Corpus luteal cyst on the right. Heterogeneous uterine enhancement can be seen with small underlying fibroids.  No acute osseous finding.  IMPRESSION: Findings are suspicious for early acute  appendicitis.  Small amount of free fluid within the pelvis is nonspecific and may be reactive or physiologic. Right-sided corpus luteal cyst.  No free intraperitoneal air or loculated fluid collection.  Discussed via telephone with Dr. Wilkie Aye at 09:00 p.m. on 10/22/2013.   Original Report Authenticated By: Jearld Lesch, M.D.    EKG Interpretation    Date/Time:    Ventricular Rate:    PR Interval:    QRS Duration:   QT Interval:    QTC Calculation:   R Axis:     Text Interpretation:              MDM   1. Appendicitis    Patient presents with right lower quadrant pain onset yesterday morning. She is nontoxic-appearing and her vital signs are within normal limits. She has tenderness to palpation over the right lower quadrant without rebound or guarding. Differential includes ovarian pathology, cyst versus torsion and appendicitis. Patient is not having any anorexia or fever and at this time I would favor ovarian pathology ever appendicitis. Labwork: Notable for mild leukocytosis. Ultrasound is negative for acute ovarian pathology. I discussed this with the patient. She continues to have mild right lower quadrant pain. THis pain was improved with ibuprofen and she's not required any narcotic pain medications. Given persistent pain, we'll obtain CT scan. CT scan is suspicious for early appendicitis. Patient will be admitted to Gen. surgery.    Shon Baton, MD 10/23/13 [phone]

## 2013-10-22 NOTE — H&P (Signed)
Renee Ochoa is an 47 y.o. female.   Chief Complaint: abdominal pain HPI: 89 yof who is otherwise healthy presents with abdominal pain for about 24 hours.  Some nausea, no vomiting, she is hungry but has not eaten.  Having bms.  No fevers.  Thought it was gyn related but then was worse leading her to go to urgent care.   Past Medical History  Diagnosis Date  . Gestational diabetes     H/0  . Arthritis   . ADD (attention deficit disorder)   . Menstrual migraine     Past Surgical History  Procedure Laterality Date  . Umbilical hernia repair    . Breast lumpectomy      RIGHT  . Resectoscopic polypectomy, endomyoresection, endometrial ablation  07/2001    Family History  Problem Relation Age of Onset  . Hypertension Father   . Breast cancer Maternal Grandmother   . Hypertension Paternal Grandfather    Social History:  reports that she has never smoked. She has never used smokeless tobacco. She reports that she drinks alcohol. She reports that she does not use illicit drugs.  Allergies:  Allergies  Allergen Reactions  . Fexofenadine Hcl Hives    meds none  Results for orders placed during the hospital encounter of 10/22/13 (from the past 48 hour(s))  COMPREHENSIVE METABOLIC PANEL     Status: None   Collection Time    10/22/13  3:20 PM      Result Value Range   Sodium 137  135 - 145 mEq/L   Potassium 4.0  3.5 - 5.1 mEq/L   Chloride 102  96 - 112 mEq/L   CO2 25  19 - 32 mEq/L   Glucose, Bld 94  70 - 99 mg/dL   BUN 6  6 - 23 mg/dL   Creatinine, Ser 1.61  0.50 - 1.10 mg/dL   Calcium 8.9  8.4 - 09.6 mg/dL   Total Protein 6.9  6.0 - 8.3 g/dL   Albumin 3.7  3.5 - 5.2 g/dL   AST 16  0 - 37 U/L   ALT 16  0 - 35 U/L   Alkaline Phosphatase 50  39 - 117 U/L   Total Bilirubin 0.4  0.3 - 1.2 mg/dL   GFR calc non Af Amer >90  >90 mL/min   GFR calc Af Amer >90  >90 mL/min   Comment: (NOTE)     The eGFR has been calculated using the CKD EPI equation.     This calculation has  not been validated in all clinical situations.     eGFR's persistently <90 mL/min signify possible Chronic Kidney     Disease.  CBC WITH DIFFERENTIAL     Status: Abnormal   Collection Time    10/22/13  3:20 PM      Result Value Range   WBC 11.7 (*) 4.0 - 10.5 K/uL   RBC 4.81  3.87 - 5.11 MIL/uL   Hemoglobin 13.9  12.0 - 15.0 g/dL   HCT 04.5  40.9 - 81.1 %   MCV 84.4  78.0 - 100.0 fL   MCH 28.9  26.0 - 34.0 pg   MCHC 34.2  30.0 - 36.0 g/dL   RDW 91.4  78.2 - 95.6 %   Platelets 249  150 - 400 K/uL   Neutrophils Relative % 70  43 - 77 %   Neutro Abs 8.1 (*) 1.7 - 7.7 K/uL   Lymphocytes Relative 17  12 - 46 %  Lymphs Abs 2.0  0.7 - 4.0 K/uL   Monocytes Relative 9  3 - 12 %   Monocytes Absolute 1.0  0.1 - 1.0 K/uL   Eosinophils Relative 4  0 - 5 %   Eosinophils Absolute 0.5  0.0 - 0.7 K/uL   Basophils Relative 0  0 - 1 %   Basophils Absolute 0.0  0.0 - 0.1 K/uL  WET PREP, GENITAL     Status: Abnormal   Collection Time    10/22/13  7:09 PM      Result Value Range   Yeast Wet Prep HPF POC NONE SEEN  NONE SEEN   Trich, Wet Prep NONE SEEN  NONE SEEN   Clue Cells Wet Prep HPF POC NONE SEEN  NONE SEEN   WBC, Wet Prep HPF POC FEW (*) NONE SEEN   US Transvaginal Non-ob  10/22/2013   CLINICAL DATA:  Abdominal pain.  Right lower quadrant pain.  EXAM: TRANSABDOMINAL AND TRANSVAGINAL ULTRASOUND OF PELVIS  TECHNIQUE: Both transabdominal and transvaginal ultrasound examinations of the pelvis were performed. Transabdominal technique was performed for global imaging of the pelvis including uterus, ovaries, adnexal regions, and pelvic cul-de-sac. It was necessary to proceed with endovaginal exam following the transabdominal exam to visualize the adnexa and uterus.  COMPARISON:  None  FINDINGS: Uterus  Measurements: Anteverted and measures 8.3 x 4.2 x 5.5 cm. Globular in shape with diffusely heterogeneous echotexture and linear areas of shadowing. These findings suggest adenomyosis. Small intramural  fibroid in the anterior fundal region measures 9 x 8 x 7 mm.  Endometrium  Thickness: 5 mm.  No focal abnormality visualized.  Right ovary  Measurements: 3.8 x 2.1 x 2.6 cm. Normal appearance/no adnexal mass.  Left ovary  Measurements: 2.5 x 1.3 x 1.4 cm. Normal appearance/no adnexal mass.  Other findings  Small amount of free pelvic fluid.  IMPRESSION: 1. No definite explanation for right lower quadrant pain. 2. 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 3. Small anterior fundal fibroid.   Electronically Signed   By: Britta Mccreedy M.D.   On: 10/22/2013 18:57   US Pelvis Complete  10/22/2013   CLINICAL DATA:  Abdominal pain.  Right lower quadrant pain.  EXAM: TRANSABDOMINAL AND TRANSVAGINAL ULTRASOUND OF PELVIS  TECHNIQUE: Both transabdominal and transvaginal ultrasound examinations of the pelvis were performed. Transabdominal technique was performed for global imaging of the pelvis including uterus, ovaries, adnexal regions, and pelvic cul-de-sac. It was necessary to proceed with endovaginal exam following the transabdominal exam to visualize the adnexa and uterus.  COMPARISON:  None  FINDINGS: Uterus  Measurements: Anteverted and measures 8.3 x 4.2 x 5.5 cm. Globular in shape with diffusely heterogeneous echotexture and linear areas of shadowing. These findings suggest adenomyosis. Small intramural fibroid in the anterior fundal region measures 9 x 8 x 7 mm.  Endometrium  Thickness: 5 mm.  No focal abnormality visualized.  Right ovary  Measurements: 3.8 x 2.1 x 2.6 cm. Normal appearance/no adnexal mass.  Left ovary  Measurements: 2.5 x 1.3 x 1.4 cm. Normal appearance/no adnexal mass.  Other findings  Small amount of free pelvic fluid.  IMPRESSION: 1. No definite explanation for right lower quadrant pain. 2. 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 3. Small anterior fundal fibroid.   Electronically Signed   By: Britta Mccreedy M.D.   On: 10/22/2013 18:57   Ct Abdomen Pelvis W Contrast  10/22/2013   *RADIOLOGY REPORT*  Clinical Data:  Abdominal pain  CT ABDOMEN AND PELVIS WITH CONTRAST  Technique:  Multidetector CT imaging  of the abdomen and pelvis was performed following the standard protocol during bolus administration of intravenous contrast.  Contrast: OMNIPAQUE IOHEXOL 300 MG/ML  SOLN  Comparison: 10/22/2013 ultrasound  Findings: Lung bases are clear.  Normal heart size.  Subcentimeter hypodensity within the left hepatic lobe is nonspecific however favored to reflect a cyst.  Mild low attenuation of the liver parenchyma is nonspecific post contrast however can be seen with fatty infiltration.  No radiodense gallstones, biliary ductal dilatation, or focal splenic or pancreatic abnormality.  Unremarkable adrenal glands.  Symmetric renal enhancement.  No hydroureteronephrosis.  No CT evidence for colitis. The appendix measures 11 mm in diameter and shows mild mucosal hyperenhancement and very mild periappendiceal fat stranding.  No free intraperitoneal air or loculated fluid collection.  There is free fluid dependently within the pelvis.  Small bowel loops are normal course and caliber.  No lymphadenopathy.  Normal caliber aorta and branch vessels.  Thin-walled bladder.  Corpus luteal cyst on the right. Heterogeneous uterine enhancement can be seen with small underlying fibroids.  No acute osseous finding.  IMPRESSION: Findings are suspicious for early acute appendicitis.  Small amount of free fluid within the pelvis is nonspecific and may be reactive or physiologic. Right-sided corpus luteal cyst.  No free intraperitoneal air or loculated fluid collection.  Discussed via telephone with Dr. Wilkie Aye at 09:00 p.m. on 10/22/2013.   Original Report Authenticated By: Jearld Lesch, M.D.    Review of Systems  Constitutional: Negative for fever and chills.  Respiratory: Negative for shortness of breath.   Cardiovascular: Negative for chest pain.  Gastrointestinal: Positive for nausea and abdominal pain. Negative for vomiting.     Blood pressure 102/58, pulse 101, temperature 97.9 F (36.6 C), temperature source Oral, resp. rate 16, weight 140 lb 9.6 oz (63.776 kg), last menstrual period 10/04/2013, SpO2 99.00%. Physical Exam  Vitals reviewed. Constitutional: She appears well-developed and well-nourished.  Eyes: No scleral icterus.  Cardiovascular: Normal rate, regular rhythm and normal heart sounds.   Respiratory: Effort normal and breath sounds normal. She has no wheezes. She has no rales.  GI: Soft. Bowel sounds are normal. There is tenderness in the right lower quadrant.     Assessment/Plan Appendicitis  She appears to have appendicitis with rlq tenderness, elevated wbc and ct consistent with appendicitis.  Discussed lap appy with her.  I have another emergency to do now and I told her would either be late tonight or one of my partners would do her surgery in am.  [facility] 10/22/2013, 9:55 PM

## 2013-10-23 ENCOUNTER — Observation Stay (HOSPITAL_COMMUNITY): Payer: Managed Care, Other (non HMO) | Admitting: Certified Registered Nurse Anesthetist

## 2013-10-23 ENCOUNTER — Encounter (HOSPITAL_COMMUNITY)
Admission: EM | Disposition: A | Discharge status: Home / Self Care | Payer: Self-pay | Source: Home / Self Care | Attending: Emergency Medicine

## 2013-10-23 ENCOUNTER — Encounter (HOSPITAL_COMMUNITY): Payer: Managed Care, Other (non HMO) | Admitting: Certified Registered Nurse Anesthetist

## 2013-10-23 HISTORY — PX: LAPAROSCOPIC APPENDECTOMY: SHX408

## 2013-10-23 LAB — CREATININE, SERUM
Creatinine, Ser: 0.59 mg/dL (ref 0.50–1.10)
GFR calc Af Amer: >90 mL/min (ref >90)
GFR calc non Af Amer: >90 mL/min (ref >90)

## 2013-10-23 LAB — CBC
HCT: 40.6 % (ref 36.0–46.0)
Hemoglobin: 14 g/dL (ref 12.0–15.0)
MCH: 29.8 pg (ref 26.0–34.0)
MCHC: 34.5 g/dL (ref 30.0–36.0)
Platelets: 226 10*3/uL (ref 150–400)
RBC: 4.7 MIL/uL (ref 3.87–5.11)
RDW: 13.1 % (ref 11.5–15.5)

## 2013-10-23 LAB — GC/CHLAMYDIA PROBE AMP: CT Probe RNA: NEGATIVE

## 2013-10-23 LAB — SURGICAL PCR SCREEN
MRSA, PCR: NEGATIVE
Staphylococcus aureus: POSITIVE — AB

## 2013-10-23 SURGERY — APPENDECTOMY, LAPAROSCOPIC
Anesthesia: General | Site: Abdomen | Wound class: Contaminated

## 2013-10-23 MED ORDER — LACTATED RINGERS IV SOLN
INTRAVENOUS | Status: DC
  Administered 2013-10-23: 09:00:00 via INTRAVENOUS

## 2013-10-23 MED ORDER — CHLORHEXIDINE GLUCONATE CLOTH 2 % EX PADS
6.0000 | MEDICATED_PAD | Freq: Every day | CUTANEOUS | Status: DC
  Administered 2013-10-23: 6 via TOPICAL

## 2013-10-23 MED ORDER — MUPIROCIN 2 % EX OINT
1.0000 "application " | TOPICAL_OINTMENT | Freq: Two times a day (BID) | CUTANEOUS | Status: DC
  Administered 2013-10-23: 1 via NASAL
  Filled 2013-10-23: qty 22

## 2013-10-23 MED ORDER — OXYCODONE HCL 5 MG/5ML PO SOLN: 5.0000 mg | Freq: Once | ORAL | Status: DC | PRN

## 2013-10-23 MED ORDER — ONDANSETRON HCL 4 MG/2ML IJ SOLN
INTRAMUSCULAR | Status: AC
  Filled 2013-10-23: qty 2

## 2013-10-23 MED ORDER — OXYCODONE-ACETAMINOPHEN 5-325 MG PO TABS
1.0000 | ORAL_TABLET | ORAL | Status: DC | PRN
  Administered 2013-10-23: 2 via ORAL
  Administered 2013-10-23: 1 via ORAL
  Administered 2013-10-24: 2 via ORAL
  Filled 2013-10-23: qty 1
  Filled 2013-10-23 (×2): qty 2

## 2013-10-23 MED ORDER — OXYCODONE HCL 5 MG PO TABS: 5.0000 mg | ORAL_TABLET | Freq: Once | ORAL | Status: DC | PRN

## 2013-10-23 MED ORDER — ROCURONIUM BROMIDE 100 MG/10ML IV SOLN
INTRAVENOUS | Status: DC | PRN
  Administered 2013-10-23: 40 mg via INTRAVENOUS

## 2013-10-23 MED ORDER — GLYCOPYRROLATE 0.2 MG/ML IJ SOLN
INTRAMUSCULAR | Status: DC | PRN
  Administered 2013-10-23: .8 mg via INTRAVENOUS

## 2013-10-23 MED ORDER — PHENYLEPHRINE HCL 10 MG/ML IJ SOLN
INTRAMUSCULAR | Status: DC | PRN
  Administered 2013-10-23 (×2): 120 ug via INTRAVENOUS
  Administered 2013-10-23: 160 ug via INTRAVENOUS

## 2013-10-23 MED ORDER — CHLORHEXIDINE GLUCONATE 4 % EX LIQD
1.0000 "application " | Freq: Once | CUTANEOUS | Status: DC
  Filled 2013-10-23: qty 15

## 2013-10-23 MED ORDER — NEOSTIGMINE METHYLSULFATE 1 MG/ML IJ SOLN
INTRAMUSCULAR | Status: DC | PRN
  Administered 2013-10-23: 4 mg via INTRAVENOUS

## 2013-10-23 MED ORDER — MIDAZOLAM HCL 5 MG/5ML IJ SOLN
INTRAMUSCULAR | Status: DC | PRN
  Administered 2013-10-23: 2 mg via INTRAVENOUS

## 2013-10-23 MED ORDER — PROPOFOL 10 MG/ML IV BOLUS
INTRAVENOUS | Status: DC | PRN
  Administered 2013-10-23: 160 mg via INTRAVENOUS

## 2013-10-23 MED ORDER — HYDROMORPHONE HCL PF 1 MG/ML IJ SOLN
2.0000 mg | INTRAMUSCULAR | Status: DC | PRN
  Administered 2013-10-23 – 2013-10-24 (×3): 1 mg via INTRAVENOUS
  Filled 2013-10-23 (×3): qty 2

## 2013-10-23 MED ORDER — FENTANYL CITRATE 0.05 MG/ML IJ SOLN
INTRAMUSCULAR | Status: DC | PRN
  Administered 2013-10-23: 100 ug via INTRAVENOUS

## 2013-10-23 MED ORDER — BUPIVACAINE HCL (PF) 0.25 % IJ SOLN
INTRAMUSCULAR | Status: AC
  Filled 2013-10-23: qty 30

## 2013-10-23 MED ORDER — LIDOCAINE HCL (CARDIAC) 20 MG/ML IV SOLN
INTRAVENOUS | Status: DC | PRN
  Administered 2013-10-23: 70 mg via INTRAVENOUS

## 2013-10-23 MED ORDER — ONDANSETRON HCL 4 MG/2ML IJ SOLN: 4.0000 mg | Freq: Four times a day (QID) | INTRAMUSCULAR | Status: DC | PRN

## 2013-10-23 MED ORDER — HEPARIN SODIUM (PORCINE) 5000 UNIT/ML IJ SOLN
5000.0000 [IU] | Freq: Three times a day (TID) | INTRAMUSCULAR | Status: DC
  Administered 2013-10-23 – 2013-10-24 (×2): 5000 [IU] via SUBCUTANEOUS
  Filled 2013-10-23 (×5): qty 1

## 2013-10-23 MED ORDER — 0.9 % SODIUM CHLORIDE (POUR BTL) OPTIME
TOPICAL | Status: DC | PRN
  Administered 2013-10-23: 1000 mL

## 2013-10-23 MED ORDER — KCL IN DEXTROSE-NACL 20-5-0.45 MEQ/L-%-% IV SOLN
INTRAVENOUS | Status: DC
  Administered 2013-10-23 – 2013-10-24 (×2): via INTRAVENOUS
  Filled 2013-10-23 (×4): qty 1000

## 2013-10-23 MED ORDER — SODIUM CHLORIDE 0.9 % IR SOLN
Status: DC | PRN
  Administered 2013-10-23: 1000 mL

## 2013-10-23 MED ORDER — BUPIVACAINE HCL (PF) 0.25 % IJ SOLN
INTRAMUSCULAR | Status: DC | PRN
  Administered 2013-10-23: 20 mL

## 2013-10-23 MED ORDER — HYDROMORPHONE HCL PF 1 MG/ML IJ SOLN
INTRAMUSCULAR | Status: AC
  Filled 2013-10-23: qty 1

## 2013-10-23 MED ORDER — DEXAMETHASONE SODIUM PHOSPHATE 4 MG/ML IJ SOLN
INTRAMUSCULAR | Status: DC | PRN
  Administered 2013-10-23: 8 mg via INTRAVENOUS

## 2013-10-23 MED ORDER — LACTATED RINGERS IV SOLN
INTRAVENOUS | Status: DC | PRN
  Administered 2013-10-23 (×2): via INTRAVENOUS

## 2013-10-23 MED ORDER — HYDROMORPHONE HCL PF 1 MG/ML IJ SOLN
0.2500 mg | INTRAMUSCULAR | Status: DC | PRN
  Administered 2013-10-23 (×2): 0.5 mg via INTRAVENOUS

## 2013-10-23 SURGICAL SUPPLY — 43 items
APPLIER CLIP 5 13 M/L LIGAMAX5 (MISCELLANEOUS)
BLADE SURG ROTATE 9660 (MISCELLANEOUS) ×2 IMPLANT
CANISTER SUCTION 2500CC (MISCELLANEOUS) ×2 IMPLANT
CHLORAPREP W/TINT 26ML (MISCELLANEOUS) ×2 IMPLANT
CLIP APPLIE 5 13 M/L LIGAMAX5 (MISCELLANEOUS) IMPLANT
COVER SURGICAL LIGHT HANDLE (MISCELLANEOUS) ×2 IMPLANT
CUTTER LINEAR ENDO 35 ART FLEX (STAPLE) ×2 IMPLANT
DECANTER SPIKE VIAL GLASS SM (MISCELLANEOUS) IMPLANT
DERMABOND ADVANCED (GAUZE/BANDAGES/DRESSINGS) ×1
DERMABOND ADVANCED .7 DNX12 (GAUZE/BANDAGES/DRESSINGS) ×1 IMPLANT
DRAPE UTILITY 15X26 W/TAPE STR (DRAPE) ×4 IMPLANT
ELECT REM PT RETURN 9FT ADLT (ELECTROSURGICAL) ×2
ELECTRODE REM PT RTRN 9FT ADLT (ELECTROSURGICAL) ×1 IMPLANT
ENDOLOOP SUT PDS II  0 18 (SUTURE)
ENDOLOOP SUT PDS II 0 18 (SUTURE) IMPLANT
GLOVE BIOGEL PI IND STRL 7.0 (GLOVE) ×3 IMPLANT
GLOVE BIOGEL PI INDICATOR 7.0 (GLOVE) ×3
GLOVE ECLIPSE 7.0 STRL STRAW (GLOVE) ×2 IMPLANT
GLOVE EUDERMIC 7 POWDERFREE (GLOVE) ×2 IMPLANT
GLOVE SS BIOGEL STRL SZ 6.5 (GLOVE) ×1 IMPLANT
GLOVE SUPERSENSE BIOGEL SZ 6.5 (GLOVE) ×1
GLOVE SURG SS PI 7.0 STRL IVOR (GLOVE) ×2 IMPLANT
GOWN STRL NON-REIN LRG LVL3 (GOWN DISPOSABLE) ×4 IMPLANT
GOWN STRL REIN XL XLG (GOWN DISPOSABLE) ×2 IMPLANT
KIT BASIN OR (CUSTOM PROCEDURE TRAY) ×2 IMPLANT
KIT ROOM TURNOVER OR (KITS) ×2 IMPLANT
NS IRRIG 1000ML POUR BTL (IV SOLUTION) ×2 IMPLANT
PAD ARMBOARD 7.5X6 YLW CONV (MISCELLANEOUS) ×4 IMPLANT
POUCH SPECIMEN RETRIEVAL 10MM (ENDOMECHANICALS) ×2 IMPLANT
RELOAD /EVU35 (ENDOMECHANICALS) IMPLANT
RELOAD CUTTER ETS 35MM STAND (ENDOMECHANICALS) IMPLANT
SCALPEL HARMONIC ACE (MISCELLANEOUS) ×2 IMPLANT
SET IRRIG TUBING LAPAROSCOPIC (IRRIGATION / IRRIGATOR) ×2 IMPLANT
SLEEVE ENDOPATH XCEL 5M (ENDOMECHANICALS) ×2 IMPLANT
SPECIMEN JAR SMALL (MISCELLANEOUS) ×2 IMPLANT
SUT MON AB 4-0 PC3 18 (SUTURE) ×2 IMPLANT
TOWEL OR 17X24 6PK STRL BLUE (TOWEL DISPOSABLE) ×2 IMPLANT
TOWEL OR 17X26 10 PK STRL BLUE (TOWEL DISPOSABLE) ×2 IMPLANT
TRAY FOLEY CATH 16FR SILVER (SET/KITS/TRAYS/PACK) ×2 IMPLANT
TRAY LAPAROSCOPIC (CUSTOM PROCEDURE TRAY) ×2 IMPLANT
TROCAR XCEL BLUNT TIP 100MML (ENDOMECHANICALS) ×2 IMPLANT
TROCAR XCEL NON-BLD 5MMX100MML (ENDOMECHANICALS) ×2 IMPLANT
WATER STERILE IRR 1000ML POUR (IV SOLUTION) IMPLANT

## 2013-10-23 NOTE — Progress Notes (Signed)
UR completed 

## 2013-10-23 NOTE — Transfer of Care (Signed)
Immediate Anesthesia Transfer of Care Note  Patient: Renee Ochoa  Procedure(s) Performed: Procedure(s): APPENDECTOMY LAPAROSCOPIC (N/A)  Patient Location: PACU  Anesthesia Type:General  Level of Consciousness: awake, alert , oriented and patient cooperative  Airway & Oxygen Therapy: Patient Spontanous Breathing and Patient connected to nasal cannula oxygen  Post-op Assessment: Report given to PACU RN, Post -op Vital signs reviewed and stable and Patient moving all extremities X 4  Post vital signs: Reviewed and stable  Complications: No apparent anesthesia complications

## 2013-10-23 NOTE — Progress Notes (Signed)
<principal problem not specified>  Assessment: Acute appendicitis  Plan: Appendectomy today. I have discussed with patient and she wishes to proceed.    Subjective: Mild Headache, still with RLQ pain, not worse  Objective: Vital signs in last 24 hours: Temp:  [97.9 F (36.6 C)-99.3 F (37.4 C)] 99.3 F (37.4 C) (12/01 0504) Pulse Rate:  [88-108] 93 (12/01 0504) Resp:  [16-18] 16 (12/01 0504) BP: (96-129)/(39-76) 96/39 mmHg (12/01 0504) SpO2:  [98 %-99 %] 98 % (12/01 0504) Weight:  [137 lb 12.8 oz (62.506 kg)-140 lb 9.6 oz (63.776 kg)] 137 lb 12.8 oz (62.506 kg) (11/30 2235) Last BM Date: 10/21/13  Intake/Output from previous day: 11/30 0701 - 12/01 0700 In: 623 [I.V.:623] Out: -   General appearance: alert, cooperative and no distress Resp: clear to auscultation bilaterally GI: Soft, still with rlq tenderness, mild rebound  Lab Results:  Results for orders placed during the hospital encounter of 10/22/13 (from the past 24 hour(s))  COMPREHENSIVE METABOLIC PANEL     Status: None   Collection Time    10/22/13  3:20 PM      Result Value Range   Sodium 137  135 - 145 mEq/L   Potassium 4.0  3.5 - 5.1 mEq/L   Chloride 102  96 - 112 mEq/L   CO2 25  19 - 32 mEq/L   Glucose, Bld 94  70 - 99 mg/dL   BUN 6  6 - 23 mg/dL   Creatinine, Ser 4.54  0.50 - 1.10 mg/dL   Calcium 8.9  8.4 - 09.8 mg/dL   Total Protein 6.9  6.0 - 8.3 g/dL   Albumin 3.7  3.5 - 5.2 g/dL   AST 16  0 - 37 U/L   ALT 16  0 - 35 U/L   Alkaline Phosphatase 50  39 - 117 U/L   Total Bilirubin 0.4  0.3 - 1.2 mg/dL   GFR calc non Af Amer >90  >90 mL/min   GFR calc Af Amer >90  >90 mL/min  CBC WITH DIFFERENTIAL     Status: Abnormal   Collection Time    10/22/13  3:20 PM      Result Value Range   WBC 11.7 (*) 4.0 - 10.5 K/uL   RBC 4.81  3.87 - 5.11 MIL/uL   Hemoglobin 13.9  12.0 - 15.0 g/dL   HCT 11.9  14.7 - 82.9 %   MCV 84.4  78.0 - 100.0 fL   MCH 28.9  26.0 - 34.0 pg   MCHC 34.2  30.0 - 36.0 g/dL   RDW 56.2  13.0 - 86.5 %   Platelets 249  150 - 400 K/uL   Neutrophils Relative % 70  43 - 77 %   Neutro Abs 8.1 (*) 1.7 - 7.7 K/uL   Lymphocytes Relative 17  12 - 46 %   Lymphs Abs 2.0  0.7 - 4.0 K/uL   Monocytes Relative 9  3 - 12 %   Monocytes Absolute 1.0  0.1 - 1.0 K/uL   Eosinophils Relative 4  0 - 5 %   Eosinophils Absolute 0.5  0.0 - 0.7 K/uL   Basophils Relative 0  0 - 1 %   Basophils Absolute 0.0  0.0 - 0.1 K/uL  WET PREP, GENITAL     Status: Abnormal   Collection Time    10/22/13  7:09 PM      Result Value Range   Yeast Wet Prep HPF POC NONE SEEN  NONE SEEN  Trich, Wet Prep NONE SEEN  NONE SEEN   Clue Cells Wet Prep HPF POC NONE SEEN  NONE SEEN   WBC, Wet Prep HPF POC FEW (*) NONE SEEN  SURGICAL PCR SCREEN     Status: Abnormal   Collection Time    10/22/13 11:49 PM      Result Value Range   MRSA, PCR NEGATIVE  NEGATIVE   Staphylococcus aureus POSITIVE (*) NEGATIVE     Studies/Results Radiology     MEDS, Scheduled . ertapenem [facility]) IV  1 g Intravenous Q24H       LOS: 1 day    Currie Paris, MD, [facility] Surgery, [place] [phone]   10/23/2013 7:24 AM

## 2013-10-23 NOTE — Anesthesia Procedure Notes (Addendum)
Procedure Name: Intubation Date/Time: 10/23/2013 10:06 AM Performed by: Rogelia Boga Pre-anesthesia Checklist: Patient identified Patient Re-evaluated:Patient Re-evaluated prior to inductionOxygen Delivery Method: Circle system utilized and Non-rebreather mask Preoxygenation: Pre-oxygenation with 100% oxygen Intubation Type: IV induction Ventilation: Mask ventilation without difficulty Laryngoscope Size: Mac and 4 Grade View: Grade I Tube type: Oral Tube size: 7.0 mm Number of attempts: 1 Airway Equipment and Method: Stylet Placement Confirmation: ETT inserted through vocal cords under direct vision,  positive ETCO2 and breath sounds checked- equal and bilateral Secured at: 21 cm Tube secured with: Tape Dental Injury: Teeth and Oropharynx as per pre-operative assessment

## 2013-10-23 NOTE — Progress Notes (Signed)
Lab called at 0415 for a positive PCR for staphylococcus aureus

## 2013-10-23 NOTE — Op Note (Signed)
Renee Ochoa 11/21/1966 [id number] 10/23/2013   Preoperative diagnosis: acute appendicitis  Postoperative diagnosis: same  Procedure: laparoscopic appendectomy  Surgeon: Currie Paris, MD, FACS  Anesthesia: General   Clinical History and Indications: this patient presented late yesterday with signs and symptoms of acute appendicitis. She was begun on intravenous antibiotics and brought to the operative room today for appendectomy    Description of Procedure: the patient was taken to the operating room, and after satisfactory general endotracheal anesthesia was obtained, a Foley catheter was placed and the abdomen prepped and draped. A timeout was performed.  I used 0.25% plain Marcaine for each incision. A vertical umbilical incision was made, the fascia identified and the peritoneal cavity entered under direct vision. A pursestring was placed and the Hassan cannula was introduced. The abdomen was insufflated to 15. Under direct vision a 5 elevator trocar was placed in the right upper quadrant and a second one just to the left of the midline in the suprapubic area.  The appendix was readily identified and was inflamed and edematous consistent with early appendicitis. The mesoappendix was divided with a harmonic scalpel down to the base of the appendix. The stapler was used to come across the appendix at its base. The patient was placed in a sterile bag and brought out through the umbilical port site. [name] was reintroduced and the abdomen reinsufflated. The staple line looked intact. There is no peritoneal fluid was suctioned out. There was no bleeding.  The 5 mm trochars were removed under direct vision and there was no bleeding. The abdomen was deflated through the umbilical site and the pursestring tied down. The skin was closed with 40 model subcuticular plus Dermabond.  The patient tolerated the procedure well. There were no complications. Blood loss was minimal. Counts  were correct.    Currie Paris, MD, FACS 10/23/2013 10:48 AM

## 2013-10-23 NOTE — Anesthesia Postprocedure Evaluation (Signed)
Anesthesia Post Note  Patient: Renee Ochoa  Procedure(s) Performed: Procedure(s) (LRB): APPENDECTOMY LAPAROSCOPIC (N/A)  Anesthesia type: General  Patient location: PACU  Post pain: Pain level controlled and Adequate analgesia  Post assessment: Post-op Vital signs reviewed, Patient's Cardiovascular Status Stable, Respiratory Function Stable, Patent Airway and Pain level controlled  Last Vitals:  Filed Vitals:   10/23/13 1121  BP: 139/52  Pulse: 78  Temp:   Resp: 16    Post vital signs: Reviewed and stable  Level of consciousness: awake, alert  and oriented  Complications: No apparent anesthesia complications

## 2013-10-23 NOTE — Anesthesia Preprocedure Evaluation (Addendum)
Anesthesia Evaluation  Patient identified by MRN, date of birth, ID band Patient awake    Reviewed: Allergy & Precautions, H&P , NPO status , Patient's Chart, lab work & pertinent test results  History of Anesthesia Complications (+) PONV  Airway Mallampati: II TM Distance: >3 FB Neck ROM: full    Dental  (+) Dental Advisory Given   Pulmonary neg pulmonary ROS,          Cardiovascular negative cardio ROS      Neuro/Psych  Headaches, ADD   GI/Hepatic   Endo/Other    Renal/GU      Musculoskeletal   Abdominal   Peds  Hematology   Anesthesia Other Findings   Reproductive/Obstetrics                          Anesthesia Physical Anesthesia Plan  ASA: II  Anesthesia Plan: General   Post-op Pain Management:    Induction: Intravenous  Airway Management Planned: Oral ETT  Additional Equipment:   Intra-op Plan:   Post-operative Plan: Extubation in OR  Informed Consent: I have reviewed the patients History and Physical, chart, labs and discussed the procedure including the risks, benefits and alternatives for the proposed anesthesia with the patient or authorized representative who has indicated his/her understanding and acceptance.   Dental advisory given  Plan Discussed with: CRNA, Anesthesiologist and Surgeon  Anesthesia Plan Comments:        Anesthesia Quick Evaluation

## 2013-10-23 NOTE — Preoperative (Signed)
Beta Blockers   Reason not to administer Beta Blockers:Not Applicable 

## 2013-10-24 MED ORDER — OXYCODONE-ACETAMINOPHEN 5-325 MG PO TABS: 1.0000 | ORAL_TABLET | ORAL | Status: DC | PRN

## 2013-10-24 NOTE — Discharge Summary (Signed)
Physician Discharge Summary  Patient ID: Renee Ochoa MRN: [id number] DOB/AGE: 1966-11-15 47 y.o.  Admit date: 10/22/2013 Discharge date: 10/24/2013  Admitting Diagnosis: Acute appendicitis   Discharge Diagnosis Patient Active Problem List   Diagnosis Date Noted  . Appendicitis 10/22/2013    Consultants none  Imaging: US Transvaginal Non-ob  10/22/2013   CLINICAL DATA:  Abdominal pain.  Right lower quadrant pain.  EXAM: TRANSABDOMINAL AND TRANSVAGINAL ULTRASOUND OF PELVIS  TECHNIQUE: Both transabdominal and transvaginal ultrasound examinations of the pelvis were performed. Transabdominal technique was performed for global imaging of the pelvis including uterus, ovaries, adnexal regions, and pelvic cul-de-sac. It was necessary to proceed with endovaginal exam following the transabdominal exam to visualize the adnexa and uterus.  COMPARISON:  None  FINDINGS: Uterus  Measurements: Anteverted and measures 8.3 x 4.2 x 5.5 cm. Globular in shape with diffusely heterogeneous echotexture and linear areas of shadowing. These findings suggest adenomyosis. Small intramural fibroid in the anterior fundal region measures 9 x 8 x 7 mm.  Endometrium  Thickness: 5 mm.  No focal abnormality visualized.  Right ovary  Measurements: 3.8 x 2.1 x 2.6 cm. Normal appearance/no adnexal mass.  Left ovary  Measurements: 2.5 x 1.3 x 1.4 cm. Normal appearance/no adnexal mass.  Other findings  Small amount of free pelvic fluid.  IMPRESSION: 1. No definite explanation for right lower quadrant pain. 2. 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 3. Small anterior fundal fibroid.   Electronically Signed   By: Britta Mccreedy M.D.   On: 10/22/2013 18:57   US Pelvis Complete  10/22/2013   CLINICAL DATA:  Abdominal pain.  Right lower quadrant pain.  EXAM: TRANSABDOMINAL AND TRANSVAGINAL ULTRASOUND OF PELVIS  TECHNIQUE: Both transabdominal and transvaginal ultrasound examinations of the pelvis were performed. Transabdominal technique was  performed for global imaging of the pelvis including uterus, ovaries, adnexal regions, and pelvic cul-de-sac. It was necessary to proceed with endovaginal exam following the transabdominal exam to visualize the adnexa and uterus.  COMPARISON:  None  FINDINGS: Uterus  Measurements: Anteverted and measures 8.3 x 4.2 x 5.5 cm. Globular in shape with diffusely heterogeneous echotexture and linear areas of shadowing. These findings suggest adenomyosis. Small intramural fibroid in the anterior fundal region measures 9 x 8 x 7 mm.  Endometrium  Thickness: 5 mm.  No focal abnormality visualized.  Right ovary  Measurements: 3.8 x 2.1 x 2.6 cm. Normal appearance/no adnexal mass.  Left ovary  Measurements: 2.5 x 1.3 x 1.4 cm. Normal appearance/no adnexal mass.  Other findings  Small amount of free pelvic fluid.  IMPRESSION: 1. No definite explanation for right lower quadrant pain. 2. 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 3. Small anterior fundal fibroid.   Electronically Signed   By: Britta Mccreedy M.D.   On: 10/22/2013 18:57   Ct Abdomen Pelvis W Contrast  10/22/2013   *RADIOLOGY REPORT*  Clinical Data: Abdominal pain  CT ABDOMEN AND PELVIS WITH CONTRAST  Technique:  Multidetector CT imaging of the abdomen and pelvis was performed following the standard protocol during bolus administration of intravenous contrast.  Contrast: OMNIPAQUE IOHEXOL 300 MG/ML  SOLN  Comparison: 10/22/2013 ultrasound  Findings: Lung bases are clear.  Normal heart size.  Subcentimeter hypodensity within the left hepatic lobe is nonspecific however favored to reflect a cyst.  Mild low attenuation of the liver parenchyma is nonspecific post contrast however can be seen with fatty infiltration.  No radiodense gallstones, biliary ductal dilatation, or focal splenic or pancreatic abnormality.  Unremarkable adrenal glands.  Symmetric renal  enhancement.  No hydroureteronephrosis.  No CT evidence for colitis. The appendix measures 11 mm in diameter and shows  mild mucosal hyperenhancement and very mild periappendiceal fat stranding.  No free intraperitoneal air or loculated fluid collection.  There is free fluid dependently within the pelvis.  Small bowel loops are normal course and caliber.  No lymphadenopathy.  Normal caliber aorta and branch vessels.  Thin-walled bladder.  Corpus luteal cyst on the right. Heterogeneous uterine enhancement can be seen with small underlying fibroids.  No acute osseous finding.  IMPRESSION: Findings are suspicious for early acute appendicitis.  Small amount of free fluid within the pelvis is nonspecific and may be reactive or physiologic. Right-sided corpus luteal cyst.  No free intraperitoneal air or loculated fluid collection.  Discussed via telephone with Dr. Wilkie Aye at 09:00 p.m. on 10/22/2013.   Original Report Authenticated By: Jearld Lesch, M.D.    Procedures laparoscopic appendectomy Ephriam Knuckles J Streck 10/23/13)     Hospital Course:  Renee Ochoa is a healthy female who presented to [facility] with abdominal pain.  Workup showed acute appendicitis.  Patient was admitted and underwent procedure listed above.  Tolerated procedure well and was transferred to the floor.  Diet was advanced as tolerated.  On POD#1, the patient was voiding well, tolerating diet, ambulating well, pain well controlled, vital signs stable, incisions c/d/i and felt stable for discharge home.  Patient will follow up in our office in 2 weeks and knows to call with questions or concerns.  Physical Exam: General:  Alert, NAD, pleasant, comfortable Abd:  Soft, ND, mild tenderness, incisions C/D/I    Medication List         cholecalciferol 1000 UNITS tablet  Commonly known as:  VITAMIN D  Take 2,000 Units by mouth daily.     ibuprofen 200 MG tablet  Commonly known as:  ADVIL,MOTRIN  Take 400 mg by mouth every 6 (six) hours as needed for moderate pain.     oxyCODONE-acetaminophen 5-325 MG per tablet  Commonly known as:  PERCOCET/ROXICET   Take 1 tablet by mouth every 4 (four) hours as needed for moderate pain.     Safflower Oil 1000 MG Caps  Take 100 mg by mouth daily.             Follow-up Information   Follow up with [facility] On 11/14/2013. (arrive by [facility] for a 2:30appt)    Contact information:   [address]   [place] [address] [phone]       Signed: Ashok Norris, [facility] [phone]  10/24/2013, 9:02 AM

## 2013-10-24 NOTE — Discharge Summary (Signed)
Agree with A&P of EP,NP. patient feels she is doing well and ready to go home

## 2013-10-24 NOTE — Progress Notes (Signed)
Pt discharged in stable condition. Discharge and prescription given to pt, voiced understanding.

## 2013-10-25 ENCOUNTER — Encounter (HOSPITAL_COMMUNITY): Payer: Self-pay | Admitting: Surgery

## 2013-10-25 NOTE — ED Provider Notes (Signed)
Medical screening examination/treatment/procedure(s) were performed by resident physician or non-physician practitioner and as supervising physician I was immediately available for consultation/collaboration.   KINDL,JAMES DOUGLAS MD.   James D Kindl, MD 10/25/13 1903 

## 2013-11-13 ENCOUNTER — Other Ambulatory Visit: Payer: Self-pay | Admitting: Dermatology

## 2013-11-14 ENCOUNTER — Ambulatory Visit (INDEPENDENT_AMBULATORY_CARE_PROVIDER_SITE_OTHER): Payer: Managed Care, Other (non HMO) | Admitting: General Surgery

## 2013-11-14 ENCOUNTER — Encounter (INDEPENDENT_AMBULATORY_CARE_PROVIDER_SITE_OTHER): Payer: Self-pay

## 2013-11-14 VITALS — BP 116/62 | HR 94 | Temp 98.0°F | Resp 18 | Ht 64.0 in | Wt 139.0 lb

## 2013-11-14 DIAGNOSIS — K3580 Unspecified acute appendicitis: Secondary | ICD-10-CM

## 2013-11-14 NOTE — Patient Instructions (Signed)
Call if you have any problems.  Have a good holiday.

## 2013-11-14 NOTE — Progress Notes (Signed)
Renee Ochoa Aug 15, 1966 [id number] 11/14/2013   Renee Ochoa is a 47 y.o. female who had a laparoscopic appendectomy with intraoperative cholangiogram by Dr. Cyndia Bent.  The pathology report confirmed  Appendix, Other than Incidental - ACUTE AND SUPPURATIVE APPENDICITIS AND SEROSITIS. - THERE IS NO EVIDENCE OF MALIGNANCY.  The patient reports that they are feeling well with normal bowel movements and good appetite.  The pre-operative symptoms of abdominal pain, nausea, and vomiting have resolved.    Physical examination - BP 116/62  Pulse 94  Temp(Src) 98 F (36.7 C)  Resp 18  Ht 5\' 4"  (1.626 m)  Wt 63.05 kg (139 lb)  BMI 23.85 kg/m2  LMP 10/04/2013  Incisions appear well-healed with no sign of infection or bleeding.   Abdomen - soft, non-tender  Impression:  s/p laparoscopic cholecystectomy  Plan:  She may resume a regular diet and full activity.  She may follow-up on a PRN basis.

## 2014-03-02 ENCOUNTER — Encounter: Payer: Self-pay | Admitting: Gynecology

## 2014-03-02 ENCOUNTER — Ambulatory Visit (INDEPENDENT_AMBULATORY_CARE_PROVIDER_SITE_OTHER): Payer: Managed Care, Other (non HMO) | Admitting: Gynecology

## 2014-03-02 VITALS — BP 112/70 | Ht 61.25 in | Wt 141.0 lb

## 2014-03-02 DIAGNOSIS — Z8632 Personal history of gestational diabetes: Secondary | ICD-10-CM

## 2014-03-02 DIAGNOSIS — Z01419 Encounter for gynecological examination (general) (routine) without abnormal findings: Secondary | ICD-10-CM

## 2014-03-02 NOTE — Patient Instructions (Signed)
Scheduled 3D mammogram in June

## 2014-03-02 NOTE — Progress Notes (Signed)
Renee Ochoa [birth year] [id number]   History:    48 y.o.  for annual gyn exam with no complaints today.Review of patient's records indicated that back in 1993 she had an abnormal Pap smear with slight dysplasia and had cryotherapy and since then her Pap smears have been normal. Patient with prior history of resectoscopic polypectomy as well as endometrial ablation back in 1998 patient cycles of been regular. 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 She states her Tdap vaccine are up-to-date. Patient divorced and not sexually active. Prior to endometrial ablation since she had been married at that time her husband had a vasectomy. Patient has had history of gestational diabetes in the past.  Patient was evaluated in 2012 by cardiologist Dr. Ellyn Hack for possible mitral valve prolapse and past history of palpitations. He has stated that she had a reassuring echocardiogram without any real significant mitral valve prolapse. He describes her as having a mild murmur, mild regurgitation consulting cardiologist last year.  Patient had a right breast biopsy in 2002 by Annabell Sabal M.D. Pathology report came back a lymph node benign. Grossly and and and a and and and a the the a the  The patient had an appendectomy December 2014  Past medical history,surgical history, family history and social history were all reviewed and documented in the EPIC chart.  Gynecologic History Patient's last menstrual period was 01/22/2014. Contraception: none Last Pap: 2014. Results were: normal Last mammogram: 2014. Results were: normal  Obstetric History OB History  Gravida Para Term Preterm AB SAB TAB Ectopic Multiple Living  2 2        2     # Outcome Date GA Lbr Len/2nd Weight Sex Delivery Anes PTL Lv  2 PAR           1 PAR                ROS: A ROS was performed and pertinent positives and negatives are included in the history.  GENERAL: No  fevers or chills. HEENT: No change in vision, no earache, sore throat or sinus congestion. NECK: No pain or stiffness. CARDIOVASCULAR: No chest pain or pressure. No palpitations. PULMONARY: No shortness of breath, cough or wheeze. GASTROINTESTINAL: No abdominal pain, nausea, vomiting or diarrhea, melena or bright red blood per rectum. GENITOURINARY: No urinary frequency, urgency, hesitancy or dysuria. MUSCULOSKELETAL: No joint or muscle pain, no back pain, no recent trauma. DERMATOLOGIC: No rash, no itching, no lesions. ENDOCRINE: No polyuria, polydipsia, no heat or cold intolerance. No recent change in weight. HEMATOLOGICAL: No anemia or easy bruising or bleeding. NEUROLOGIC: No headache, seizures, numbness, tingling or weakness. PSYCHIATRIC: No depression, no loss of interest in normal activity or change in sleep pattern.     Exam: chaperone present  BP 112/70  Ht 5' 1.25" (1.556 m)  Wt 141 lb (63.957 kg)  BMI 26.42 kg/m2  LMP 01/22/2014  Body mass index is 26.42 kg/(m^2).  General appearance : Well developed well nourished female. No acute distress HEENT: Neck supple, trachea midline, no carotid bruits, no thyroidmegaly Lungs: Clear to auscultation, no rhonchi or wheezes, or rib retractions  Heart: Regular rate and rhythm, no murmurs or gallops Breast:Examined in sitting and supine position were symmetrical in appearance, no palpable masses or tenderness,  no skin retraction, no nipple inversion, no nipple discharge, no skin discoloration, no axillary or supraclavicular lymphadenopathy Abdomen: no palpable masses or tenderness, no rebound or guarding Extremities:  no edema or skin discoloration or tenderness  Pelvic:  Bartholin, Urethra, Skene Glands: Within normal limits             Vagina: No gross lesions or discharge  Cervix: No gross lesions or discharge  Uterus  anteverted, normal size, shape and consistency, non-tender and mobile  Adnexa  Without masses or tenderness  Anus and  perineum  normal   Rectovaginal  normal sphincter tone without palpated masses or tenderness             Hemoccult not indicated     Assessment/Plan:  48 y.o. female for annual exam will return back to the office next week and a fasting states her that we may do the following blood work: Comprehensive metabolic panel, fasting lipid profile, TSH, CBC, as well as urinalysis. Pap smear not done today in accordance to the new guidelines. Patient was reminded to schedule her mammogram in June of this year and to request a 3-D just her breasts were dense on last mammogram. She was encouraged to do monthly breast exams. Her Tdap vaccine was approximately 5 years ago.    Note: This dictation was prepared with  Dragon/digital dictation along withSmart phrase technology. Any transcriptional errors that result from this process are unintentional.   Terrance Mass MD, 2:53 PM 03/02/2014

## 2014-03-14 ENCOUNTER — Other Ambulatory Visit: Payer: Managed Care, Other (non HMO)

## 2014-03-14 DIAGNOSIS — Z01419 Encounter for gynecological examination (general) (routine) without abnormal findings: Secondary | ICD-10-CM

## 2014-03-14 LAB — COMPREHENSIVE METABOLIC PANEL
ALT: 10 U/L (ref 0–35)
AST: 12 U/L (ref 0–37)
Albumin: 3.9 g/dL (ref 3.5–5.2)
Alkaline Phosphatase: 45 U/L (ref 39–117)
BILIRUBIN TOTAL: 0.5 mg/dL (ref 0.2–1.2)
BUN: 9 mg/dL (ref 6–23)
CALCIUM: 8.9 mg/dL (ref 8.4–10.5)
CHLORIDE: 105 meq/L (ref 96–112)
CO2: 26 mEq/L (ref 19–32)
Creat: 0.7 mg/dL (ref 0.50–1.10)
Glucose, Bld: 84 mg/dL (ref 70–99)
Potassium: 4.4 mEq/L (ref 3.5–5.3)
SODIUM: 139 meq/L (ref 135–145)
TOTAL PROTEIN: 6.1 g/dL (ref 6.0–8.3)

## 2014-03-14 LAB — LIPID PANEL
CHOL/HDL RATIO: 3 ratio
Cholesterol: 183 mg/dL (ref 0–200)
HDL: 61 mg/dL (ref >39)
LDL CALC: 106 mg/dL — AB (ref 0–99)
Triglycerides: 78 mg/dL (ref <150)
VLDL: 16 mg/dL (ref 0–40)

## 2014-03-14 LAB — CBC WITH DIFFERENTIAL/PLATELET
Basophils Absolute: 0.1 10*3/uL (ref 0.0–0.1)
Basophils Relative: 1 % (ref 0–1)
Eosinophils Absolute: 0.3 10*3/uL (ref 0.0–0.7)
Eosinophils Relative: 5 % (ref 0–5)
HCT: 40.8 % (ref 36.0–46.0)
Hemoglobin: 14.1 g/dL (ref 12.0–15.0)
LYMPHS PCT: 25 % (ref 12–46)
Lymphs Abs: 1.5 10*3/uL (ref 0.7–4.0)
MCH: 28.7 pg (ref 26.0–34.0)
MCHC: 34.6 g/dL (ref 30.0–36.0)
MCV: 82.9 fL (ref 78.0–100.0)
Monocytes Absolute: 0.8 10*3/uL (ref 0.1–1.0)
Monocytes Relative: 13 % — ABNORMAL HIGH (ref 3–12)
NEUTROS ABS: 3.3 10*3/uL (ref 1.7–7.7)
NEUTROS PCT: 56 % (ref 43–77)
PLATELETS: 287 10*3/uL (ref 150–400)
RBC: 4.92 MIL/uL (ref 3.87–5.11)
RDW: 13.8 % (ref 11.5–15.5)
WBC: 5.9 10*3/uL (ref 4.0–10.5)

## 2014-03-14 LAB — TSH: TSH: 1.212 u[IU]/mL (ref 0.350–4.500)

## 2014-03-15 LAB — URINALYSIS W MICROSCOPIC + REFLEX CULTURE
Bacteria, UA: NONE SEEN
Bilirubin Urine: NEGATIVE
Casts: NONE SEEN
Crystals: NONE SEEN
GLUCOSE, UA: NEGATIVE mg/dL
Hgb urine dipstick: NEGATIVE
Ketones, ur: NEGATIVE mg/dL
LEUKOCYTES UA: NEGATIVE
Nitrite: NEGATIVE
Protein, ur: NEGATIVE mg/dL
SQUAMOUS EPITHELIAL / LPF: NONE SEEN
Specific Gravity, Urine: 1.02 (ref 1.005–1.030)
Urobilinogen, UA: 0.2 mg/dL (ref 0.0–1.0)
pH: 6.5 (ref 5.0–8.0)

## 2014-09-24 ENCOUNTER — Encounter: Payer: Self-pay | Admitting: Gynecology

## 2014-12-03 ENCOUNTER — Encounter: Payer: Self-pay | Admitting: Gynecology

## 2015-01-12 IMAGING — CT CT ABD-PELV W/ CM
2 of 5 series · 17 of 46 positions shown, 19 images · IV contrast (CONTRAST)
Comparison: 10/22/2013 ultrasound

CLINICAL DATA: Abdominal pain

CT ABDOMEN AND PELVIS WITH CONTRAST
TECHNIQUE: Multidetector CT imaging of the abdomen and pelvis was
performed following the standard protocol during bolus
administration of intravenous contrast.
Contrast: 100mL OMNIPAQUE IOHEXOL 300 MG/ML  SOLN

[Series 2: routine · axial · 0.68mm/px · z∈[-451,-76]mm · 14 of 86 slices shown, 16 images]
[im 6/86  soft-tissue]
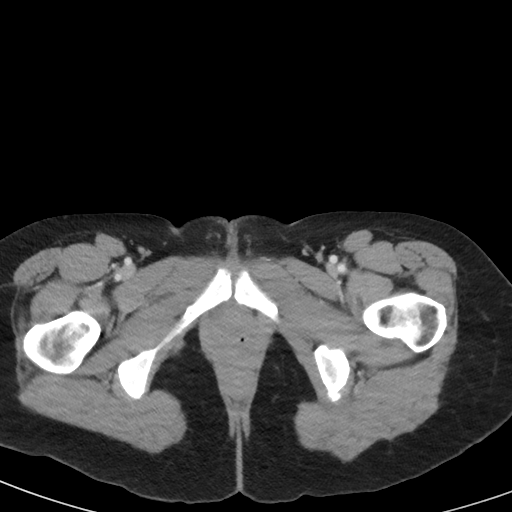
[im 6/86  bone]
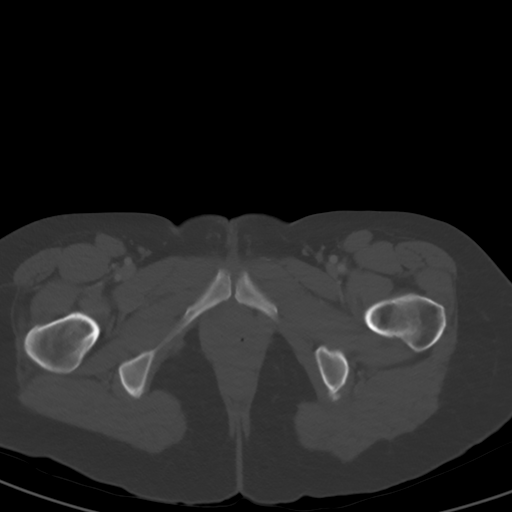
[im 11/86  soft-tissue]
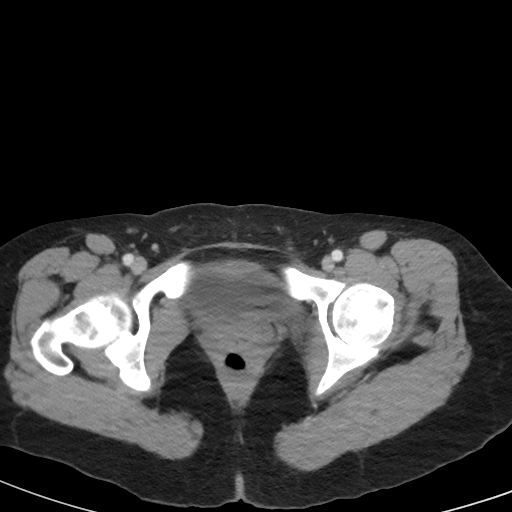
[im 16/86  soft-tissue]
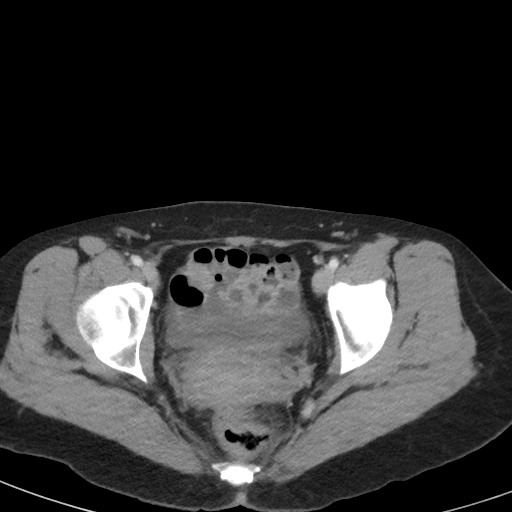
[im 26/86  soft-tissue]
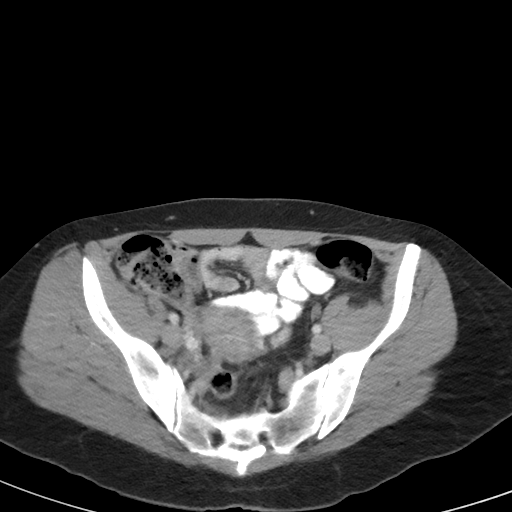
[im 31/86  soft-tissue]
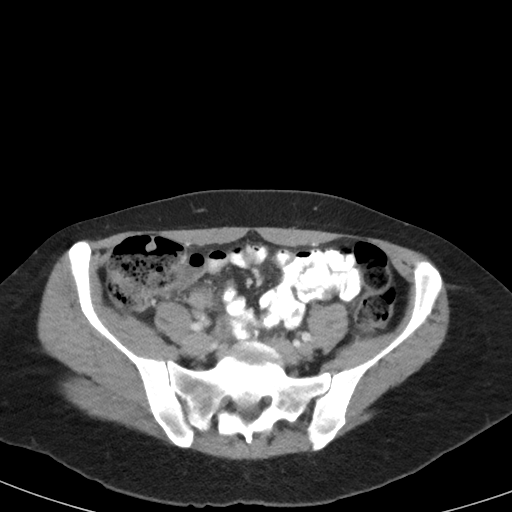
[im 36/86  soft-tissue]
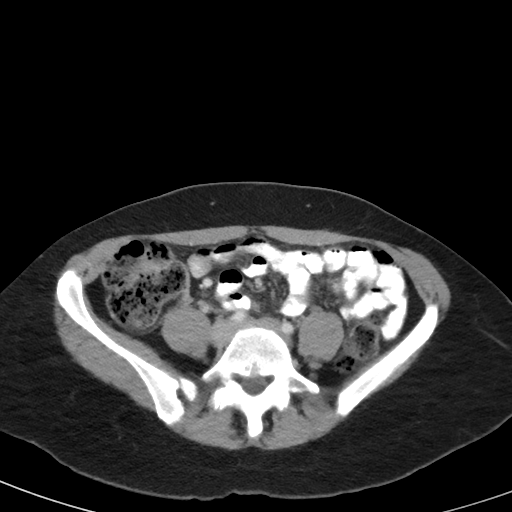
[im 41/86  soft-tissue]
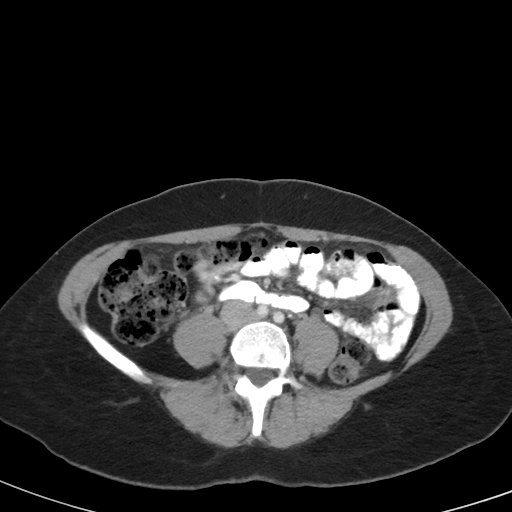
[im 46/86  soft-tissue]
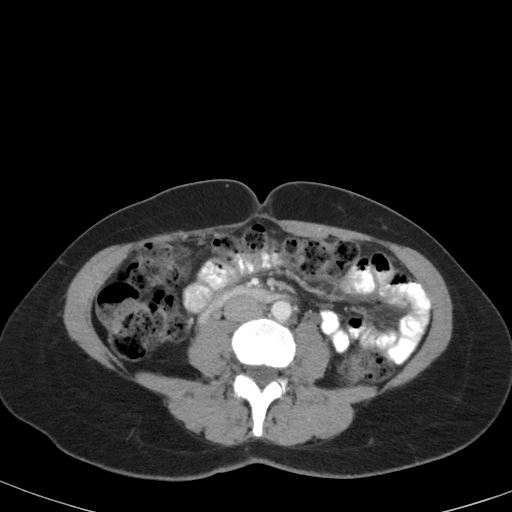
[im 51/86  soft-tissue]
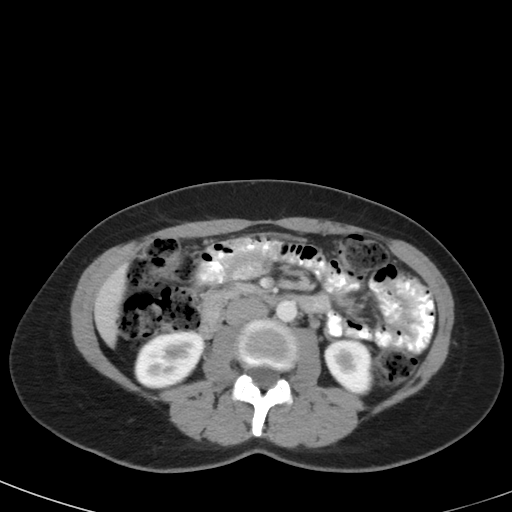
[im 51/86  bone]
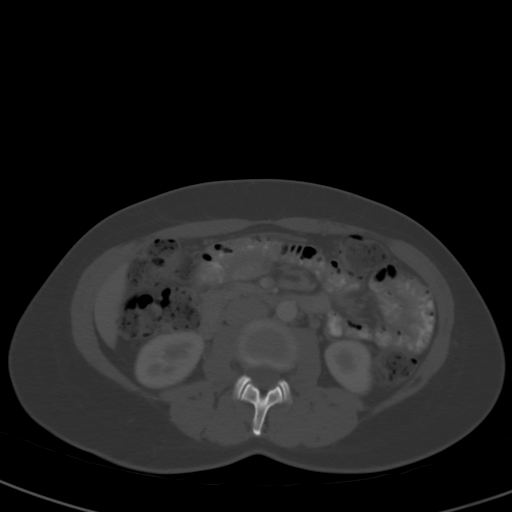
[im 56/86  soft-tissue]
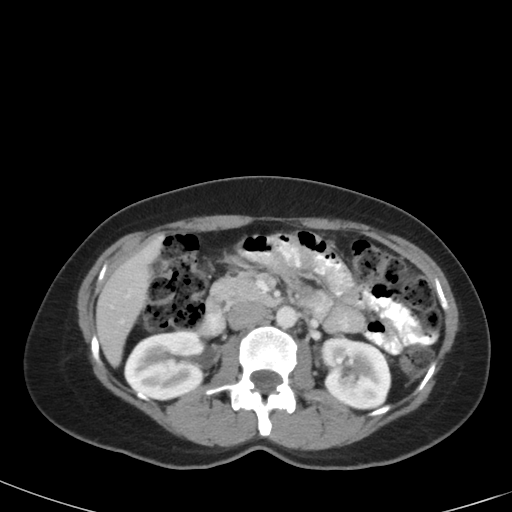
[im 66/86  soft-tissue]
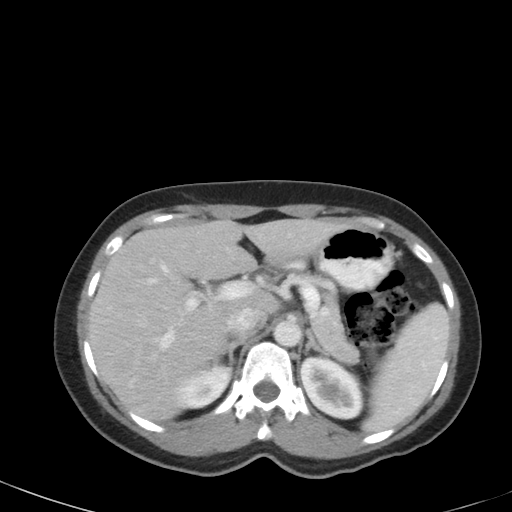
[im 71/86  soft-tissue]
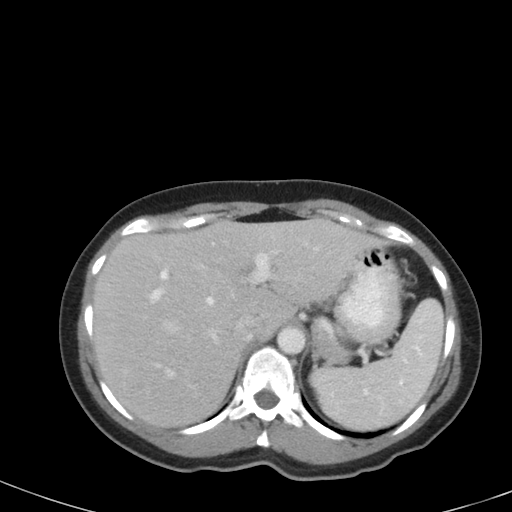
[im 76/86  soft-tissue]
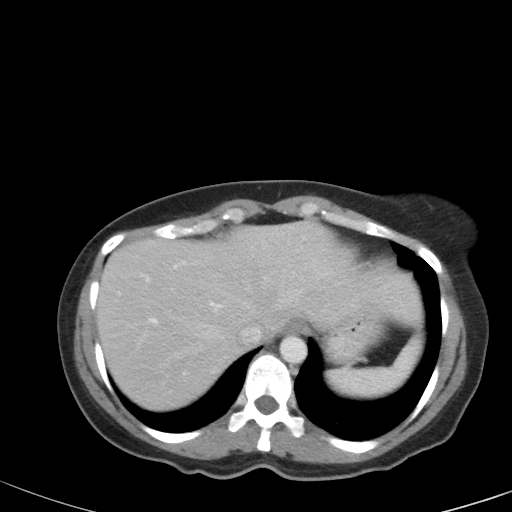
[im 81/86  soft-tissue]
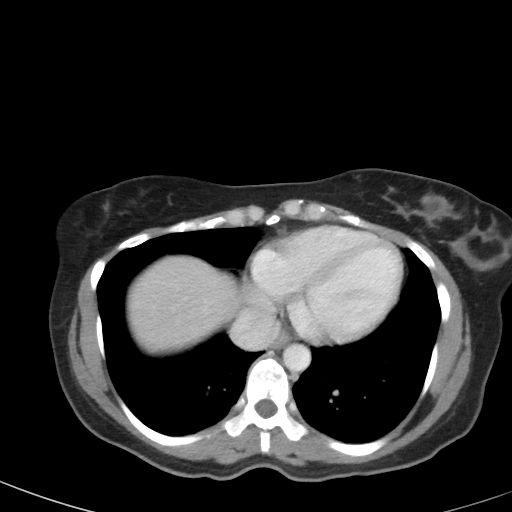

[mpr, coronals, coronal · coronal · 0.83mm/px · 3 of 68 slices shown]
[im 23/68  soft-tissue]
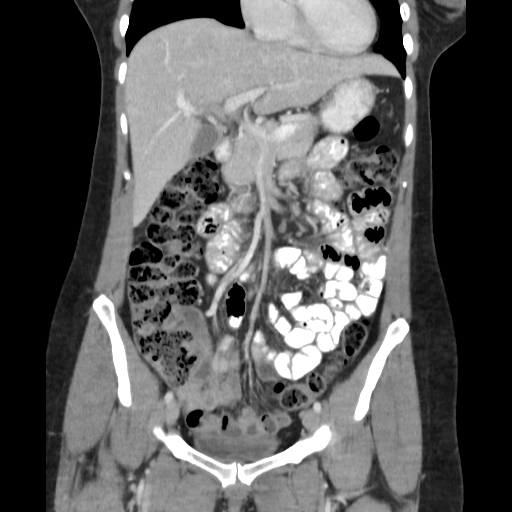
[im 30/68  soft-tissue]
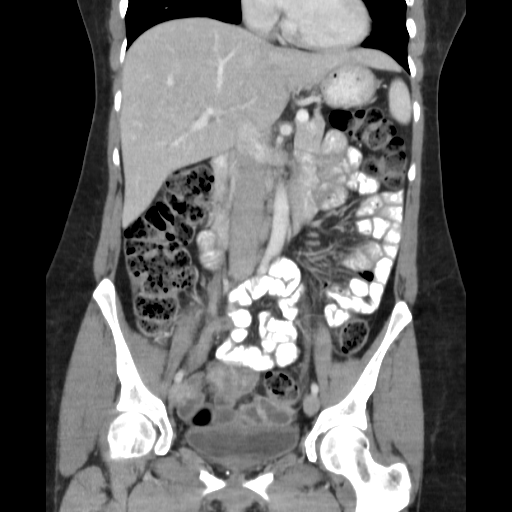
[im 38/68  soft-tissue]
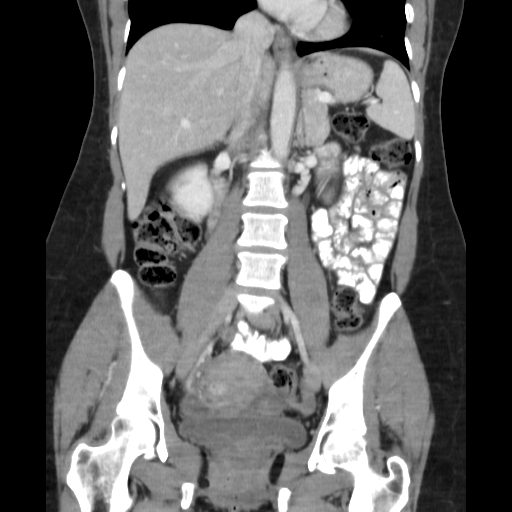

[17 of 46 positions shown; findings below may reference images not displayed]

FINDINGS: Lung bases are clear.  Normal heart size.

Subcentimeter hypodensity within the left hepatic lobe is
nonspecific however favored to reflect a cyst.  Mild low
attenuation of the liver parenchyma is nonspecific post contrast
however can be seen with fatty infiltration.  No radiodense
gallstones, biliary ductal dilatation, or focal splenic or
pancreatic abnormality.  Unremarkable adrenal glands.  Symmetric
renal enhancement.  No hydroureteronephrosis.

No CT evidence for colitis. The appendix measures 11 mm in diameter
and shows mild mucosal hyperenhancement and very mild
periappendiceal fat stranding.  No free intraperitoneal air or
loculated fluid collection.  There is free fluid dependently within
the pelvis.  Small bowel loops are normal course and caliber.  No
lymphadenopathy.  Normal caliber aorta and branch vessels.

Thin-walled bladder.  Corpus luteal cyst on the right.
Heterogeneous uterine enhancement can be seen with small underlying
fibroids.

No acute osseous finding.
IMPRESSION: Findings are suspicious for early acute appendicitis.

Small amount of free fluid within the pelvis is nonspecific and may
be reactive or physiologic. Right-sided corpus luteal cyst.

No free intraperitoneal air or loculated fluid collection.

Discussed via telephone with Dr. Arpit at [DATE] p.m. on
10/22/2013.

## 2015-12-06 ENCOUNTER — Encounter: Payer: Self-pay | Admitting: Gynecology

## 2016-01-15 ENCOUNTER — Ambulatory Visit (INDEPENDENT_AMBULATORY_CARE_PROVIDER_SITE_OTHER): Payer: PRIVATE HEALTH INSURANCE | Admitting: Gynecology

## 2016-01-15 ENCOUNTER — Other Ambulatory Visit: Payer: Self-pay | Admitting: Gynecology

## 2016-01-15 ENCOUNTER — Other Ambulatory Visit (HOSPITAL_COMMUNITY)
Admission: RE | Admit: 2016-01-15 | Discharge: 2016-01-15 | Disposition: A | Discharge status: Home / Self Care | Payer: PRIVATE HEALTH INSURANCE | Source: Ambulatory Visit | Attending: Gynecology | Admitting: Gynecology

## 2016-01-15 ENCOUNTER — Encounter: Payer: Self-pay | Admitting: Gynecology

## 2016-01-15 VITALS — BP 128/86 | Ht 61.5 in | Wt 135.8 lb

## 2016-01-15 DIAGNOSIS — N939 Abnormal uterine and vaginal bleeding, unspecified: Secondary | ICD-10-CM

## 2016-01-15 DIAGNOSIS — Z01419 Encounter for gynecological examination (general) (routine) without abnormal findings: Secondary | ICD-10-CM

## 2016-01-15 DIAGNOSIS — N951 Menopausal and female climacteric states: Secondary | ICD-10-CM | POA: Diagnosis not present

## 2016-01-15 DIAGNOSIS — N938 Other specified abnormal uterine and vaginal bleeding: Secondary | ICD-10-CM

## 2016-01-15 DIAGNOSIS — Z01411 Encounter for gynecological examination (general) (routine) with abnormal findings: Secondary | ICD-10-CM | POA: Diagnosis not present

## 2016-01-15 LAB — CBC WITH DIFFERENTIAL/PLATELET
Basophils Absolute: 0 10*3/uL (ref 0.0–0.1)
Basophils Relative: 0 % (ref 0–1)
EOS ABS: 0.3 10*3/uL (ref 0.0–0.7)
Eosinophils Relative: 4 % (ref 0–5)
HEMATOCRIT: 43.2 % (ref 36.0–46.0)
Hemoglobin: 15 g/dL (ref 12.0–15.0)
LYMPHS ABS: 2 10*3/uL (ref 0.7–4.0)
Lymphocytes Relative: 25 % (ref 12–46)
MCH: 30.2 pg (ref 26.0–34.0)
MCHC: 34.7 g/dL (ref 30.0–36.0)
MCV: 87.1 fL (ref 78.0–100.0)
MPV: 9.6 fL (ref 8.6–12.4)
Monocytes Absolute: 0.9 10*3/uL (ref 0.1–1.0)
Monocytes Relative: 11 % (ref 3–12)
Neutro Abs: 4.7 10*3/uL (ref 1.7–7.7)
Neutrophils Relative %: 60 % (ref 43–77)
PLATELETS: 310 10*3/uL (ref 150–400)
RBC: 4.96 MIL/uL (ref 3.87–5.11)
RDW: 14 % (ref 11.5–15.5)
WBC: 7.8 10*3/uL (ref 4.0–10.5)

## 2016-01-15 LAB — LIPID PANEL
CHOL/HDL RATIO: 2.5 ratio (ref <=5.0)
CHOLESTEROL: 185 mg/dL (ref 125–200)
HDL: 73 mg/dL (ref >=46)
LDL Cholesterol: 96 mg/dL (ref <130)
TRIGLYCERIDES: 81 mg/dL (ref <150)
VLDL: 16 mg/dL (ref <30)

## 2016-01-15 LAB — COMPREHENSIVE METABOLIC PANEL
ALBUMIN: 4.2 g/dL (ref 3.6–5.1)
ALT: 12 U/L (ref 6–29)
AST: 15 U/L (ref 10–35)
Alkaline Phosphatase: 45 U/L (ref 33–115)
BUN: 8 mg/dL (ref 7–25)
CALCIUM: 9.7 mg/dL (ref 8.6–10.2)
CO2: 28 mmol/L (ref 20–31)
Chloride: 101 mmol/L (ref 98–110)
Creat: 0.76 mg/dL (ref 0.50–1.10)
Glucose, Bld: 96 mg/dL (ref 65–99)
POTASSIUM: 4.1 mmol/L (ref 3.5–5.3)
Sodium: 139 mmol/L (ref 135–146)
TOTAL PROTEIN: 6.9 g/dL (ref 6.1–8.1)
Total Bilirubin: 0.4 mg/dL (ref 0.2–1.2)

## 2016-01-15 NOTE — Patient Instructions (Signed)
Perimenopause Perimenopause is the time when your body begins to move into the menopause (no menstrual period for 12 straight months). It is a natural process. Perimenopause can begin 2-8 years before the menopause and usually lasts for 1 year after the menopause. During this time, your ovaries may or may not produce an egg. The ovaries vary in their production of estrogen and progesterone hormones each month. This can cause irregular menstrual periods, difficulty getting pregnant, vaginal bleeding between periods, and uncomfortable symptoms. CAUSES  Irregular production of the ovarian hormones, estrogen and progesterone, and not ovulating every month.  Other causes include:  Tumor of the pituitary gland in the brain.  Medical disease that affects the ovaries.  Radiation treatment.  Chemotherapy.  Unknown causes.  Heavy smoking and excessive alcohol intake can bring on perimenopause sooner. SIGNS AND SYMPTOMS   Hot flashes.  Night sweats.  Irregular menstrual periods.  Decreased sex drive.  Vaginal dryness.  Headaches.  Mood swings.  Depression.  Memory problems.  Irritability.  Tiredness.  Weight gain.  Trouble getting pregnant.  The beginning of losing bone cells (osteoporosis).  The beginning of hardening of the arteries (atherosclerosis). DIAGNOSIS  Your health care provider will make a diagnosis by analyzing your age, menstrual history, and symptoms. He or she will do a physical exam and note any changes in your body, especially your female organs. Female hormone tests may or may not be helpful depending on the amount of female hormones you produce and when you produce them. However, other hormone tests may be helpful to rule out other problems. TREATMENT  In some cases, no treatment is needed. The decision on whether treatment is necessary during the perimenopause should be made by you and your health care provider based on how the symptoms are affecting you  and your lifestyle. Various treatments are available, such as:  Treating individual symptoms with a specific medicine for that symptom.  Herbal medicines that can help specific symptoms.  Counseling.  Group therapy. HOME CARE INSTRUCTIONS   Keep track of your menstrual periods (when they occur, how heavy they are, how long between periods, and how long they last) as well as your symptoms and when they started.  Only take over-the-counter or prescription medicines as directed by your health care provider.  Sleep and rest.  Exercise.  Eat a diet that contains calcium (good for your bones) and soy (acts like the estrogen hormone).  Do not smoke.  Avoid alcoholic beverages.  Take vitamin supplements as recommended by your health care provider. Taking vitamin E may help in certain cases.  Take calcium and vitamin D supplements to help prevent bone loss.  Group therapy is sometimes helpful.  Acupuncture may help in some cases. SEEK MEDICAL CARE IF:   You have questions about any symptoms you are having.  You need a referral to a specialist (gynecologist, psychiatrist, or psychologist). SEEK IMMEDIATE MEDICAL CARE IF:   You have vaginal bleeding.  Your period lasts longer than 8 days.  Your periods are recurring sooner than 21 days.  You have bleeding after intercourse.  You have severe depression.  You have pain when you urinate.  You have severe headaches.  You have vision problems.   This information is not intended to replace advice given to you by your health care provider. Make sure you discuss any questions you have with your health care provider.   Document Released: 12/17/2004 Document Revised: 11/30/2014 Document Reviewed: 06/08/2013 Elsevier Interactive Patient Education [employer].  ENDOMETRIAL BIOPSY POST-PROCEDURE INSTRUCTIONS  1. You may take Ibuprofen, Aleve or Tylenol for pain if needed.  Cramping should resolve within in 24  hours.  2. You may have a small amount of spotting.  You should wear a mini pad for the next few days.  3. You may have intercourse after 24 hours.  4. You need to call if you have any pelvic pain, fever, heavy bleeding or foul smelling vaginal discharge.  5. Shower or bathe as normal  6. We will call you within one week with results or we will discuss   the results at your follow-up appointment if needed.

## 2016-01-15 NOTE — Progress Notes (Signed)
Renee Ochoa 04-29-1966 [id number]   History:    50 y.o.  for annual gyn exam who was last seen in the office in 2015. Patient back in 1993 had abnormal Pap smear with slight dysplasia and had cryotherapy and since then her Pap smear had been normal.Patient with prior history of resectoscopic polypectomy as well as endometrial ablation back in 1998. Patient stated that last year she was experiencing up to 17 hot flashes a day but now she has not had one in over a month although she brought a list of her menstrual cycle from the month of December S follows: December 2015 2 mid January she was spotting and bleeding every day. That she then had a cycle for March 12 through April 26. She had a normal menstrual cycle in June but did not have one in July and August and then she bled for mid September to October 21. She did not have a menstrual cycle in November or December 2016 or January 2017 and she began bleeding February 17 until today.Patient was evaluated in 2012 by cardiologist Dr. Ellyn Hack for possible mitral valve prolapse and past history of palpitations. He has stated that she had a reassuring echocardiogram without any real significant mitral valve prolapse. He describes her as having a mild murmur, mild regurgitation consulting cardiologist last year.  Patient had a right breast biopsy in 2002 by Annabell Sabal M.D. Pathology report came back a lymph node benign  Patient has not been sexually active in over 2 years. Review of her record indicated she lost 6 pounds she is trying to exercise and eat healthier.  Past medical history,surgical history, family history and social history were all reviewed and documented in the EPIC chart.  Gynecologic History Patient's last menstrual period was 01/10/2016. Contraception: none Last Pap: 2014. Results were: normal Last mammogram: 2017. Results were: normal  Obstetric History OB History  Gravida Para Term Preterm AB SAB TAB Ectopic Multiple  Living  2 2        2     # Outcome Date GA Lbr Len/2nd Weight Sex Delivery Anes PTL Lv  2 Para           1 Para                ROS: A ROS was performed and pertinent positives and negatives are included in the history.  GENERAL: No fevers or chills. HEENT: No change in vision, no earache, sore throat or sinus congestion. NECK: No pain or stiffness. CARDIOVASCULAR: No chest pain or pressure. No palpitations. PULMONARY: No shortness of breath, cough or wheeze. GASTROINTESTINAL: No abdominal pain, nausea, vomiting or diarrhea, melena or bright red blood per rectum. GENITOURINARY: No urinary frequency, urgency, hesitancy or dysuria. MUSCULOSKELETAL: No joint or muscle pain, no back pain, no recent trauma. DERMATOLOGIC: No rash, no itching, no lesions. ENDOCRINE: No polyuria, polydipsia, no heat or cold intolerance. No recent change in weight. HEMATOLOGICAL: No anemia or easy bruising or bleeding. NEUROLOGIC: No headache, seizures, numbness, tingling or weakness. PSYCHIATRIC: No depression, no loss of interest in normal activity or change in sleep pattern.     Exam: chaperone present  BP 128/86 mmHg  Ht 5' 1.5" (1.562 m)  Wt 135 lb 12.8 oz (61.598 kg)  BMI 25.25 kg/m2  LMP 01/10/2016  Body mass index is 25.25 kg/(m^2).  General appearance : Well developed well nourished female. No acute distress HEENT: Eyes: no retinal hemorrhage or exudates,  Neck supple, trachea midline, no carotid  bruits, no thyroidmegaly Lungs: Clear to auscultation, no rhonchi or wheezes, or rib retractions  Heart: Regular rate and rhythm, no murmurs or gallops Breast:Examined in sitting and supine position were symmetrical in appearance, no palpable masses or tenderness,  no skin retraction, no nipple inversion, no nipple discharge, no skin discoloration, no axillary or supraclavicular lymphadenopathy Abdomen: no palpable masses or tenderness, no rebound or guarding Extremities: no edema or skin discoloration or  tenderness  Pelvic:  [place], Urethra, Skene Glands: Within normal limits             Vagina: No gross lesions some dark old blood present in the vaginal vault Cervix: No gross lesions or discharge  Uterus  anteverted, normal size, shape and consistency, non-tender and mobile  Adnexa  Without masses or tenderness  Anus and perineum  normal   Rectovaginal  normal sphincter tone without palpated masses or tenderness             Hemoccult not indicated   Due to patient's dysfunctional uterine bleeding she was counseled for an endometrial biopsy. Her cervix was cleansed with Betadine solution and a sterile Pipelle was introduced into the uterine cavity several passes were done in an effort to get some tissue to submit for histological evaluation.  Assessment/Plan:  50 y.o. female for annual exam perimenopausal patient stated she started her menarche at age 65 with dysfunctional uterine bleeding. An endometrial biopsy was done today. Patient will return back next week for sonohysterogram and the following fasting blood work: Fasting lipid profile, comprehensive metabolic panel, TSH, CBC, and urinalysis. Pap smear was done today. Patient was reminded importance of monthly breast exams. We discussed importance of calcium vitamin D and regular exercise for osteoporosis prevention. We will also check her [place] as well.   Terrance Mass MD, 2:38 PM 01/15/2016

## 2016-01-16 LAB — URINALYSIS W MICROSCOPIC + REFLEX CULTURE
Bacteria, UA: NONE SEEN [HPF]
Bilirubin Urine: NEGATIVE
CASTS: NONE SEEN [LPF]
Crystals: NONE SEEN [HPF]
Glucose, UA: NEGATIVE
Hgb urine dipstick: NEGATIVE
Ketones, ur: NEGATIVE
Leukocytes, UA: NEGATIVE
Nitrite: NEGATIVE
PH: 7.5 (ref 5.0–8.0)
Protein, ur: NEGATIVE
RBC / HPF: NONE SEEN RBC/HPF (ref <=2)
SPECIFIC GRAVITY, URINE: 1.006 (ref 1.001–1.035)
Squamous Epithelial / LPF: NONE SEEN [HPF] (ref <=5)
WBC, UA: NONE SEEN WBC/HPF (ref <=5)
YEAST: NONE SEEN [HPF]

## 2016-01-16 LAB — TSH: TSH: 1.05 mIU/L

## 2016-01-16 LAB — FOLLICLE STIMULATING HORMONE: FSH: 14.6 m[IU]/mL

## 2016-01-20 LAB — CYTOLOGY - PAP

## 2016-01-21 ENCOUNTER — Telehealth: Payer: Self-pay | Admitting: Gynecology

## 2016-01-21 NOTE — Telephone Encounter (Signed)
01/21/16-I spoke w/pt to let her know that her [employer] ins is putting the sonohysterogram towards her unmet $500 deductible and then she has a 20% coins until oop of $3500 is met. Allowable is $698.82 for test so pt is responsible for the $500 plus 20% coins on difference of $198.82 of $39.76. Total due would be $539.76. Offered 1/2 down or at least $100 and payments for a few months. She is leaving message w/nurse to ask JF if she has to have this done and will advise if she wishes to cancel this after hearing back from the office.wl

## 2016-01-29 ENCOUNTER — Ambulatory Visit: Payer: PRIVATE HEALTH INSURANCE | Admitting: Gynecology

## 2016-01-29 ENCOUNTER — Other Ambulatory Visit: Payer: PRIVATE HEALTH INSURANCE

## 2016-01-29 ENCOUNTER — Other Ambulatory Visit: Payer: Self-pay | Admitting: Gynecology

## 2016-01-29 DIAGNOSIS — N939 Abnormal uterine and vaginal bleeding, unspecified: Secondary | ICD-10-CM

## 2016-02-06 ENCOUNTER — Ambulatory Visit (INDEPENDENT_AMBULATORY_CARE_PROVIDER_SITE_OTHER): Payer: PRIVATE HEALTH INSURANCE

## 2016-02-06 ENCOUNTER — Other Ambulatory Visit: Payer: Self-pay | Admitting: Gynecology

## 2016-02-06 ENCOUNTER — Ambulatory Visit (INDEPENDENT_AMBULATORY_CARE_PROVIDER_SITE_OTHER): Payer: PRIVATE HEALTH INSURANCE | Admitting: Gynecology

## 2016-02-06 ENCOUNTER — Encounter: Payer: Self-pay | Admitting: Gynecology

## 2016-02-06 DIAGNOSIS — N939 Abnormal uterine and vaginal bleeding, unspecified: Secondary | ICD-10-CM

## 2016-02-06 DIAGNOSIS — D251 Intramural leiomyoma of uterus: Secondary | ICD-10-CM

## 2016-02-06 DIAGNOSIS — N831 Corpus luteum cyst of ovary, unspecified side: Secondary | ICD-10-CM | POA: Diagnosis not present

## 2016-02-06 DIAGNOSIS — N841 Polyp of cervix uteri: Secondary | ICD-10-CM

## 2016-02-06 DIAGNOSIS — N926 Irregular menstruation, unspecified: Secondary | ICD-10-CM

## 2016-02-06 NOTE — Progress Notes (Signed)
    Renee Ochoa 09-09-1966 [id number]        50 y.o.  G2P2 Presents for sonohysterogram.  Patient saw Dr. Toney Rakes recently for annual exam with complaints of irregular periods where she will skip months as well as have several menses within a month. [place] TSH were normal. She does have a history of endometrial ablation in the past. Endometrial biopsy done at the 01/15/2016 office visit showed degenerating secretory endometrium.  Past medical history,surgical history, problem list, medications, allergies, family history and social history were all reviewed and documented in the EPIC chart.  Directed ROS with pertinent positives and negatives documented in the history of present illness/assessment and plan.  Exam: [place] assistant There were no vitals filed for this visit. General appearance:  Normal Abdomen soft nontender without masses guarding rebound Pelvic external BUS vagina normal. Cervix with small endocervical polyp extruding from the os. Uterus grossly normal size midline mobile nontender. Adnexa without masses or tenderness  Ultrasound shows uterus to be normal size and echotexture. 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 Right ovary normal with physiologic changes. Left ovary with classic corpus luteal cyst 24 x 20 x 20 mm. Cul-de-sac negative.  Sonohysterogram was performed, sterile technique, cervical polyp was biopsied off and sent to pathology, easy catheter introduction, adequate distention with no abnormalities. No endometrial biopsy taken his recent biopsy was benign.  Assessment/Plan:  50 y.o. G2P2 with irregular bleeding. Normal FSH/TSH. Normal-appearing endometrial cavity status post ablation. Options to include hormonal manipulation, observation, possible Mirena IUD discussed. Patient not interested in doing anything interventional but prefers just keep a menstrual calendar for now. Will follow up if significant irregularity. Will follow up for the endocervical polyp biopsy  results.    Anastasio Auerbach MD, 3:22 PM 02/06/2016

## 2016-02-06 NOTE — Patient Instructions (Signed)
Office will call you with the polyp biopsy results. Otherwise keep track of your menses and if significant irregularity follow up for evaluation and treatment.

## 2016-12-04 ENCOUNTER — Encounter: Payer: Self-pay | Admitting: Gynecology

## 2017-04-07 ENCOUNTER — Encounter: Payer: Self-pay | Admitting: Gynecology

## 2017-07-08 ENCOUNTER — Encounter: Payer: Self-pay | Admitting: Gynecology

## 2017-07-08 ENCOUNTER — Ambulatory Visit (INDEPENDENT_AMBULATORY_CARE_PROVIDER_SITE_OTHER): Payer: PRIVATE HEALTH INSURANCE | Admitting: Gynecology

## 2017-07-08 VITALS — BP 116/76 | Ht 61.0 in | Wt 137.0 lb

## 2017-07-08 DIAGNOSIS — Z01419 Encounter for gynecological examination (general) (routine) without abnormal findings: Secondary | ICD-10-CM | POA: Diagnosis not present

## 2017-07-08 DIAGNOSIS — N951 Menopausal and female climacteric states: Secondary | ICD-10-CM | POA: Diagnosis not present

## 2017-07-08 DIAGNOSIS — Z13220 Encounter for screening for lipoid disorders: Secondary | ICD-10-CM | POA: Diagnosis not present

## 2017-07-08 LAB — CBC WITH DIFFERENTIAL/PLATELET
BASOS ABS: 95 {cells}/uL (ref 0–200)
Basophils Relative: 1 %
EOS PCT: 4 %
Eosinophils Absolute: 380 cells/uL (ref 15–500)
HCT: 43.5 % (ref 35.0–45.0)
Hemoglobin: 14.7 g/dL (ref 11.7–15.5)
LYMPHS ABS: 2660 {cells}/uL (ref 850–3900)
Lymphocytes Relative: 28 %
MCH: 29.5 pg (ref 27.0–33.0)
MCHC: 33.8 g/dL (ref 32.0–36.0)
MCV: 87.2 fL (ref 80.0–100.0)
MPV: 10.4 fL (ref 7.5–12.5)
Monocytes Absolute: 570 cells/uL (ref 200–950)
Monocytes Relative: 6 %
NEUTROS ABS: 5795 {cells}/uL (ref 1500–7800)
Neutrophils Relative %: 61 %
Platelets: 301 10*3/uL (ref 140–400)
RBC: 4.99 MIL/uL (ref 3.80–5.10)
RDW: 13.4 % (ref 11.0–15.0)
WBC: 9.5 10*3/uL (ref 3.8–10.8)

## 2017-07-08 NOTE — Patient Instructions (Signed)
Followup in one year for annual exam, sooner as needed. 

## 2017-07-08 NOTE — Progress Notes (Signed)
    Renee Ochoa 07-29-1966 [id number]        51 y.o.  G2P2 for annual exam.  Former patient of Dr. Toney Rakes. Patient doing well without complaints.  Past medical history,surgical history, problem list, medications, allergies, family history and social history were all reviewed and documented as reviewed in the EPIC chart.  ROS:  Performed with pertinent positives and negatives included in the history, assessment and plan.   Additional significant findings :  None   Exam: Renee Ochoa assistant Vitals:   07/08/17 1503  BP: 116/76  Weight: 137 lb (62.1 kg)  Height: 5\' 1"  (1.549 m)   Body mass index is 25.89 kg/m.  General appearance:  Normal affect, orientation and appearance. Skin: Grossly normal HEENT: Without gross lesions.  No cervical or supraclavicular adenopathy. Thyroid normal.  Lungs:  Clear without wheezing, rales or rhonchi Cardiac: RR, without RMG Abdominal:  Soft, nontender, without masses, guarding, rebound, organomegaly or hernia Breasts:  Examined lying and sitting without masses, retractions, discharge or axillary adenopathy. Pelvic:  Ext, BUS, Vagina: Normal  Cervix: Normal  Uterus: Anteverted, normal size, shape and contour, midline and mobile nontender   Adnexa: Without masses or tenderness    Anus and perineum: Normal   Rectovaginal: Normal sphincter tone without palpated masses or tenderness.    Assessment/Plan:  51 y.o. G2P2 female for annual exam without menses, vasectomy birth control.   1. Postmenopausal. Patient's last menstrual period was November 2017. Is having hot flushes and sweats. Reviewed options for her to include OTC products up to and including HRT. I discussed the issues of HRT to include risks versus benefits. Thrombosis such as stroke heart attack DVT and breast cancer issues versus benefits to include symptom relief and possible cardiovascular and bone health and started early all discussed. Patient is not interested in starting HRT  at this time. She prefers to try OTC products and will follow up if she wants to rediscuss HRT. 2. Pap smear 2017. No Pap smear done today. Per Dr. Sandrea Hughs note patient had cryosurgery 1993 for slight dysplasia with normal Pap smears since then. Plan repeat Pap smear at 3 year interval per current screening guidelines. 3.  Mammography 11/2016. Continue with annual mammography when due. Breast exam normal today. SBE monthly reviewed. 4. Health maintenance. Patient requests baseline labs. CBC, CMP, lipid profile, urinalysis ordered. Follow up 1 year, sooner as needed.   Anastasio Auerbach MD, 3:32 PM 07/08/2017

## 2017-07-09 ENCOUNTER — Encounter: Payer: Self-pay | Admitting: *Deleted

## 2017-07-09 ENCOUNTER — Other Ambulatory Visit: Payer: Self-pay | Admitting: *Deleted

## 2017-07-09 DIAGNOSIS — E7800 Pure hypercholesterolemia, unspecified: Secondary | ICD-10-CM

## 2017-07-09 LAB — URINALYSIS W MICROSCOPIC + REFLEX CULTURE
BILIRUBIN URINE: NEGATIVE
Bacteria, UA: NONE SEEN [HPF]
CRYSTALS: NONE SEEN [HPF]
Casts: NONE SEEN [LPF]
GLUCOSE, UA: NEGATIVE
Hgb urine dipstick: NEGATIVE
LEUKOCYTES UA: NEGATIVE
NITRITE: NEGATIVE
PH: 5.5 (ref 5.0–8.0)
Protein, ur: NEGATIVE
RBC / HPF: NONE SEEN RBC/HPF (ref <=2)
Specific Gravity, Urine: 1.012 (ref 1.001–1.035)
Squamous Epithelial / LPF: NONE SEEN [HPF] (ref <=5)
WBC, UA: NONE SEEN WBC/HPF (ref <=5)
Yeast: NONE SEEN [HPF]

## 2017-07-09 LAB — COMPREHENSIVE METABOLIC PANEL
ALT: 13 U/L (ref 6–29)
AST: 17 U/L (ref 10–35)
Albumin: 4.4 g/dL (ref 3.6–5.1)
Alkaline Phosphatase: 63 U/L (ref 33–115)
BUN: 18 mg/dL (ref 7–25)
CO2: 21 mmol/L (ref 20–32)
Calcium: 9.5 mg/dL (ref 8.6–10.2)
Chloride: 99 mmol/L (ref 98–110)
Creat: 0.8 mg/dL (ref 0.50–1.10)
Glucose, Bld: 67 mg/dL (ref 65–99)
Potassium: 4.4 mmol/L (ref 3.5–5.3)
Sodium: 137 mmol/L (ref 135–146)
Total Bilirubin: 0.4 mg/dL (ref 0.2–1.2)
Total Protein: 6.6 g/dL (ref 6.1–8.1)

## 2017-07-09 LAB — LIPID PANEL
Cholesterol: 210 mg/dL — ABNORMAL HIGH (ref <200)
HDL: 67 mg/dL (ref >50)
LDL CALC: 126 mg/dL — AB (ref <100)
Total CHOL/HDL Ratio: 3.1 Ratio (ref <5.0)
Triglycerides: 84 mg/dL (ref <150)
VLDL: 17 mg/dL (ref <30)

## 2017-08-13 DIAGNOSIS — Z0289 Encounter for other administrative examinations: Secondary | ICD-10-CM

## 2018-07-11 ENCOUNTER — Encounter: Payer: PRIVATE HEALTH INSURANCE | Admitting: Gynecology

## 2018-07-15 ENCOUNTER — Ambulatory Visit (INDEPENDENT_AMBULATORY_CARE_PROVIDER_SITE_OTHER): Payer: PRIVATE HEALTH INSURANCE | Admitting: Gynecology

## 2018-07-15 ENCOUNTER — Encounter: Payer: Self-pay | Admitting: Gynecology

## 2018-07-15 VITALS — BP 114/64 | Ht 61.0 in | Wt 133.0 lb

## 2018-07-15 DIAGNOSIS — N951 Menopausal and female climacteric states: Secondary | ICD-10-CM | POA: Diagnosis not present

## 2018-07-15 DIAGNOSIS — Z01419 Encounter for gynecological examination (general) (routine) without abnormal findings: Secondary | ICD-10-CM

## 2018-07-15 DIAGNOSIS — Z13220 Encounter for screening for lipoid disorders: Secondary | ICD-10-CM

## 2018-07-15 DIAGNOSIS — Z1321 Encounter for screening for nutritional disorder: Secondary | ICD-10-CM

## 2018-07-15 NOTE — Patient Instructions (Signed)
Follow-up in 1 year for annual exam, sooner if any issues. 

## 2018-07-15 NOTE — Progress Notes (Signed)
    Renee Ochoa 1966/05/28 [id number]        52 y.o.  G2P2 for annual gynecologic exam.   Past medical history,surgical history, problem list, medications, allergies, family history and social history were all reviewed and documented as reviewed in the EPIC chart.  ROS:  Performed with pertinent positives and negatives included in the history, assessment and plan.   Additional significant findings : None   Exam: [name] assistant Vitals:   07/15/18 0813  BP: 114/64  Weight: 133 lb (60.3 kg)  Height: 5\' 1"  (1.549 m)   Body mass index is 25.13 kg/m.  General appearance:  Normal affect, orientation and appearance. Skin: Grossly normal HEENT: Without gross lesions.  No cervical or supraclavicular adenopathy. Thyroid normal.  Lungs:  Clear without wheezing, rales or rhonchi Cardiac: RR, without RMG Abdominal:  Soft, nontender, without masses, guarding, rebound, organomegaly or hernia Breasts:  Examined lying and sitting without masses, retractions, discharge or axillary adenopathy. Pelvic:  Ext, BUS, Vagina: Normal  Cervix: Normal.  Pap smear done  Uterus: Anteverted, normal size, shape and contour, midline and mobile nontender   Adnexa: Without masses or tenderness    Anus and perineum: Normal   Rectovaginal: Normal sphincter tone without palpated masses or tenderness.    Assessment/Plan:  52 y.o. G2P2 female for annual gynecologic exam without menses, vasectomy birth control.   1. Postmenopausal.  Continues without menses.  Is having hot flushes and sweats.  We have discussed treatment options in the past.  I rediscussed options today to include OTC products as well as HRT.  At this point the patient is not interested in HRT.  She will follow-up if her symptoms worsen and she wants to rediscuss treatment options.  Check TSH to rule out thyroid dysfunction as etiology for her symptoms 2. Mammography 2019.  Continue with annual mammography when due.  Breast exam normal  today. 3. Pap smear 12/2015.  Pap smear done today.  History of cryosurgery 1993 for slight dysplasia with normal Pap smears since. 4. Health maintenance.  Patient requests baseline labs.  CBC, CMP, lipid profile, TSH, vitamin D level done.  Follow-up 1 year, sooner as needed.   Anastasio Auerbach MD, 8:43 AM 07/15/2018

## 2018-07-15 NOTE — Addendum Note (Signed)
Addended by: Nelva Nay on: 07/15/2018 08:49 AM   Modules accepted: Orders

## 2018-07-16 LAB — VITAMIN D 25 HYDROXY (VIT D DEFICIENCY, FRACTURES): Vit D, 25-Hydroxy: 37 ng/mL (ref 30–100)

## 2018-07-16 LAB — CBC WITH DIFFERENTIAL/PLATELET
Basophils Absolute: 62 cells/uL (ref 0–200)
Basophils Relative: 1.2 %
EOS PCT: 6.2 %
Eosinophils Absolute: 322 cells/uL (ref 15–500)
HCT: 42 % (ref 35.0–45.0)
Hemoglobin: 13.9 g/dL (ref 11.7–15.5)
LYMPHS ABS: 1716 {cells}/uL (ref 850–3900)
MCH: 28.7 pg (ref 27.0–33.0)
MCHC: 33.1 g/dL (ref 32.0–36.0)
MCV: 86.8 fL (ref 80.0–100.0)
MPV: 10.3 fL (ref 7.5–12.5)
Monocytes Relative: 11.1 %
NEUTROS PCT: 48.5 %
Neutro Abs: 2522 cells/uL (ref 1500–7800)
Platelets: 297 10*3/uL (ref 140–400)
RBC: 4.84 10*6/uL (ref 3.80–5.10)
RDW: 12.8 % (ref 11.0–15.0)
Total Lymphocyte: 33 %
WBC mixed population: 577 cells/uL (ref 200–950)
WBC: 5.2 10*3/uL (ref 3.8–10.8)

## 2018-07-16 LAB — COMPREHENSIVE METABOLIC PANEL
AG Ratio: 1.8 (calc) (ref 1.0–2.5)
ALBUMIN MSPROF: 4.2 g/dL (ref 3.6–5.1)
ALT: 16 U/L (ref 6–29)
AST: 15 U/L (ref 10–35)
Alkaline phosphatase (APISO): 47 U/L (ref 33–130)
BILIRUBIN TOTAL: 0.5 mg/dL (ref 0.2–1.2)
BUN: 11 mg/dL (ref 7–25)
CALCIUM: 9.6 mg/dL (ref 8.6–10.4)
CHLORIDE: 106 mmol/L (ref 98–110)
CO2: 29 mmol/L (ref 20–32)
Creat: 0.91 mg/dL (ref 0.50–1.05)
Globulin: 2.4 g/dL (calc) (ref 1.9–3.7)
Glucose, Bld: 85 mg/dL (ref 65–99)
POTASSIUM: 4.3 mmol/L (ref 3.5–5.3)
Sodium: 143 mmol/L (ref 135–146)
Total Protein: 6.6 g/dL (ref 6.1–8.1)

## 2018-07-16 LAB — TSH: TSH: 1.47 mIU/L

## 2018-07-16 LAB — LIPID PANEL
Cholesterol: 215 mg/dL — ABNORMAL HIGH (ref <200)
HDL: 73 mg/dL (ref >50)
LDL CHOLESTEROL (CALC): 126 mg/dL — AB
Non-HDL Cholesterol (Calc): 142 mg/dL (calc) — ABNORMAL HIGH (ref <130)
Total CHOL/HDL Ratio: 2.9 (calc) (ref <5.0)
Triglycerides: 64 mg/dL (ref <150)

## 2018-07-18 ENCOUNTER — Encounter: Payer: Self-pay | Admitting: Gynecology

## 2018-07-18 LAB — PAP IG W/ RFLX HPV ASCU

## 2018-09-06 ENCOUNTER — Other Ambulatory Visit: Payer: Self-pay | Admitting: Nurse Practitioner

## 2018-09-06 DIAGNOSIS — R1031 Right lower quadrant pain: Secondary | ICD-10-CM

## 2018-09-12 ENCOUNTER — Ambulatory Visit
Admission: RE | Admit: 2018-09-12 | Discharge: 2018-09-12 | Disposition: A | Discharge status: Home / Self Care | Payer: 59 | Source: Ambulatory Visit | Attending: Nurse Practitioner | Admitting: Nurse Practitioner

## 2018-09-12 DIAGNOSIS — R1031 Right lower quadrant pain: Secondary | ICD-10-CM

## 2018-09-16 ENCOUNTER — Other Ambulatory Visit: Payer: Self-pay | Admitting: Nurse Practitioner

## 2018-09-20 ENCOUNTER — Other Ambulatory Visit: Payer: Self-pay | Admitting: Nurse Practitioner

## 2018-09-20 DIAGNOSIS — R1031 Right lower quadrant pain: Secondary | ICD-10-CM

## 2018-09-24 ENCOUNTER — Ambulatory Visit
Admission: RE | Admit: 2018-09-24 | Discharge: 2018-09-24 | Disposition: A | Discharge status: Home / Self Care | Payer: PRIVATE HEALTH INSURANCE | Source: Ambulatory Visit | Attending: Nurse Practitioner | Admitting: Nurse Practitioner

## 2018-09-24 DIAGNOSIS — R1031 Right lower quadrant pain: Secondary | ICD-10-CM

## 2018-09-24 MED ORDER — IOPAMIDOL (ISOVUE-300) INJECTION 61%
100.0000 mL | Freq: Once | INTRAVENOUS | Status: AC | PRN
  Administered 2018-09-24: 100 mL via INTRAVENOUS

## 2018-10-13 ENCOUNTER — Encounter: Payer: Self-pay | Admitting: Gynecology

## 2018-10-13 ENCOUNTER — Ambulatory Visit (INDEPENDENT_AMBULATORY_CARE_PROVIDER_SITE_OTHER): Payer: PRIVATE HEALTH INSURANCE | Admitting: Gynecology

## 2018-10-13 VITALS — BP 118/74

## 2018-10-13 DIAGNOSIS — R1031 Right lower quadrant pain: Secondary | ICD-10-CM

## 2018-10-13 NOTE — Patient Instructions (Signed)
Follow-up with gastroenterology as we discussed

## 2018-10-13 NOTE — Progress Notes (Signed)
    Renee Ochoa 12/02/1965 [id number]        52 y.o.  G2P2 presents with several months of right lower quadrant discomfort.  Last annual GYN exam 06/2018 was normal with normal pelvic exam.  Patient since then has developed a intermittent burning type discomfort in the right lower quadrant around McBurney's point that comes and goes.  History of appendectomy in the past.  She is always had an issue with constipation but no new bowel changes such as worsening constipation or diarrhea.  No nausea or vomiting associated with the pain.  No fever or chills.  No urinary symptoms.  Had abdominal ultrasound which showed a liver cyst but otherwise unremarkable.  Follow-up CT scan again confirmed liver cyst but otherwise was unremarkable to include pelvic views with normal-appearing uterus and negative adnexa.  No adenopathy or ascites.  No bleeding.  Past medical history,surgical history, problem list, medications, allergies, family history and social history were all reviewed and documented in the EPIC chart.  Directed ROS with pertinent positives and negatives documented in the history of present illness/assessment and plan.  Exam: [name] assistant Vitals:   10/13/18 0930  BP: 118/74   General appearance:  Normal Abdomen soft nontender without masses guarding rebound.  No evidence of hernia. Pelvic external BUS vagina with atrophic changes.  Cervix with atrophic changes.  Uterus normal size midline mobile nontender.  Adnexa without masses or tenderness.  Rectal exam is normal.  Assessment/Plan:  52 y.o. G2P2 with 2 months of intermittent right lower quadrant pain at McBurney's point.  History of appendectomy in the past.  Recent CT scan showed normal pelvic organs.  Pelvic exam is normal.  Discussed with patient that I could order a pelvic ultrasound but I think the yield from that is negligible and that I think that her pain is GI related.  Whether she is having some adhesive pain pulling on  intestines causing some discomfort at times due to activity or fecal movement.  Does not sound like intermittent obstruction without nausea vomiting or significant pain.  She is in the process of arranging a GI appointment regardless and she is going to follow-up with them for further evaluation.  I spent a total of 15 face-to-face minutes with the patient, over 50% was spent counseling and coordination of care.     Anastasio Auerbach MD, 9:48 AM 10/13/2018

## 2018-10-14 LAB — URINALYSIS, COMPLETE W/RFL CULTURE
Bacteria, UA: NONE SEEN /HPF
Bilirubin Urine: NEGATIVE
GLUCOSE, UA: NEGATIVE
HYALINE CAST: NONE SEEN /LPF
Hgb urine dipstick: NEGATIVE
Ketones, ur: NEGATIVE
Leukocyte Esterase: NEGATIVE
NITRITES URINE, INITIAL: NEGATIVE
PH: 7 (ref 5.0–8.0)
Protein, ur: NEGATIVE
RBC / HPF: NONE SEEN /HPF (ref 0–2)
Specific Gravity, Urine: 1.01 (ref 1.001–1.03)
WBC, UA: NONE SEEN /HPF (ref 0–5)

## 2018-10-14 LAB — NO CULTURE INDICATED

## 2018-12-13 ENCOUNTER — Encounter: Payer: Self-pay | Admitting: Gynecology

## 2019-07-14 ENCOUNTER — Other Ambulatory Visit: Payer: Self-pay

## 2019-07-17 ENCOUNTER — Encounter: Payer: Self-pay | Admitting: Gynecology

## 2019-07-17 ENCOUNTER — Ambulatory Visit (INDEPENDENT_AMBULATORY_CARE_PROVIDER_SITE_OTHER): Payer: PRIVATE HEALTH INSURANCE | Admitting: Gynecology

## 2019-07-17 ENCOUNTER — Other Ambulatory Visit: Payer: Self-pay

## 2019-07-17 VITALS — BP 118/74 | Ht 62.0 in | Wt 134.0 lb

## 2019-07-17 DIAGNOSIS — F5101 Primary insomnia: Secondary | ICD-10-CM | POA: Diagnosis not present

## 2019-07-17 DIAGNOSIS — Z13220 Encounter for screening for lipoid disorders: Secondary | ICD-10-CM | POA: Diagnosis not present

## 2019-07-17 DIAGNOSIS — Z01419 Encounter for gynecological examination (general) (routine) without abnormal findings: Secondary | ICD-10-CM

## 2019-07-17 DIAGNOSIS — R7989 Other specified abnormal findings of blood chemistry: Secondary | ICD-10-CM

## 2019-07-17 NOTE — Telephone Encounter (Signed)
I think it is a good idea to have the annual flu shot.  The shingles shot is recommended to start at age 52.  Some people wait until age 57 but it does have an indication starting at age 66.  You can obtain both of these at the pharmacies.  I think the pneumonia shot is a little premature at this point.

## 2019-07-17 NOTE — Progress Notes (Signed)
    Renee Ochoa 1965/11/25 [id number]        53 y.o.  G2P2 for annual gynecologic exam.  Having some issues with insomnia.  Was seen last year for right lower quadrant pain.  Has subsequently been seen by GI.  Past medical history,surgical history, problem list, medications, allergies, family history and social history were all reviewed and documented as reviewed in the EPIC chart.  ROS:  Performed with pertinent positives and negatives included in the history, assessment and plan.   Additional significant findings : None   Exam: [name] assistant Vitals:   07/17/19 0903  BP: 118/74  Weight: 134 lb (60.8 kg)  Height: 5\' 2"  (1.575 m)   Body mass index is 24.51 kg/m.  General appearance:  Normal affect, orientation and appearance. Skin: Grossly normal HEENT: Without gross lesions.  No cervical or supraclavicular adenopathy. Thyroid normal.  Lungs:  Clear without wheezing, rales or rhonchi Cardiac: RR, without RMG Abdominal:  Soft, nontender, without masses, guarding, rebound, organomegaly or hernia Breasts:  Examined lying and sitting without masses, retractions, discharge or axillary adenopathy. Pelvic:  Ext, BUS, Vagina: Normal  Cervix: Normal  Uterus: Anteverted, normal size, shape and contour, midline and mobile nontender   Adnexa: Without masses or tenderness    Anus and perineum: Normal   Rectovaginal: Normal sphincter tone without palpated masses or tenderness.    Assessment/Plan:  53 y.o. G2P2 female for annual gynecologic exam.   1. Postmenopausal.  No significant menopausal symptoms or any vaginal bleeding. 2. Insomnia.  Having some issues with insomnia.  Using melatonin.  Son having some medical issues which is stressful for her.  Will try Estroven OTC and see how she does with this. 3. Mammography 11/2018.  Continue with annual mammography when due.  Breast exam normal today. 4. Pap smear 2019.  No Pap smear done today.  History of cryosurgery 1993 for  slight dysplasia with normal Pap smears since.  Plan repeat Pap smear at 3-year interval per current screening guidelines. 5. Health maintenance.  CBC, CMP, lipid profile, TSH secondary to insomnia, vitamin D secondary to low vitamin D in the past.  Follow-up 1 year, sooner as needed.   Anastasio Auerbach MD, 9:31 AM 07/17/2019

## 2019-07-17 NOTE — Patient Instructions (Signed)
Follow-up in 1 year for annual exam, sooner if any issues. 

## 2019-07-18 ENCOUNTER — Encounter: Payer: Self-pay | Admitting: Gynecology

## 2019-07-18 LAB — CBC WITH DIFFERENTIAL/PLATELET
Absolute Monocytes: 488 cells/uL (ref 200–950)
Basophils Absolute: 60 cells/uL (ref 0–200)
Basophils Relative: 1.3 %
Eosinophils Absolute: 276 cells/uL (ref 15–500)
Eosinophils Relative: 6 %
HCT: 41.7 % (ref 35.0–45.0)
Hemoglobin: 14 g/dL (ref 11.7–15.5)
Lymphs Abs: 1835 cells/uL (ref 850–3900)
MCH: 29 pg (ref 27.0–33.0)
MCHC: 33.6 g/dL (ref 32.0–36.0)
MCV: 86.5 fL (ref 80.0–100.0)
MPV: 10.7 fL (ref 7.5–12.5)
Monocytes Relative: 10.6 %
Neutro Abs: 1941 cells/uL (ref 1500–7800)
Neutrophils Relative %: 42.2 %
Platelets: 280 10*3/uL (ref 140–400)
RBC: 4.82 10*6/uL (ref 3.80–5.10)
RDW: 12.7 % (ref 11.0–15.0)
Total Lymphocyte: 39.9 %
WBC: 4.6 10*3/uL (ref 3.8–10.8)

## 2019-07-18 LAB — LIPID PANEL
Cholesterol: 229 mg/dL — ABNORMAL HIGH (ref <200)
HDL: 74 mg/dL (ref >=50)
LDL Cholesterol (Calc): 139 mg/dL (calc) — ABNORMAL HIGH
Non-HDL Cholesterol (Calc): 155 mg/dL (calc) — ABNORMAL HIGH (ref <130)
Total CHOL/HDL Ratio: 3.1 (calc) (ref <5.0)
Triglycerides: 68 mg/dL (ref <150)

## 2019-07-18 LAB — COMPREHENSIVE METABOLIC PANEL
AG Ratio: 2 (calc) (ref 1.0–2.5)
ALT: 10 U/L (ref 6–29)
AST: 14 U/L (ref 10–35)
Albumin: 4.3 g/dL (ref 3.6–5.1)
Alkaline phosphatase (APISO): 41 U/L (ref 37–153)
BUN: 14 mg/dL (ref 7–25)
CO2: 28 mmol/L (ref 20–32)
Calcium: 9.7 mg/dL (ref 8.6–10.4)
Chloride: 105 mmol/L (ref 98–110)
Creat: 0.84 mg/dL (ref 0.50–1.05)
Globulin: 2.2 g/dL (calc) (ref 1.9–3.7)
Glucose, Bld: 92 mg/dL (ref 65–99)
Potassium: 4.1 mmol/L (ref 3.5–5.3)
Sodium: 140 mmol/L (ref 135–146)
Total Bilirubin: 0.7 mg/dL (ref 0.2–1.2)
Total Protein: 6.5 g/dL (ref 6.1–8.1)

## 2019-07-18 LAB — TSH: TSH: 1.44 mIU/L

## 2019-07-18 LAB — VITAMIN D 25 HYDROXY (VIT D DEFICIENCY, FRACTURES): Vit D, 25-Hydroxy: 39 ng/mL (ref 30–100)

## 2019-08-16 ENCOUNTER — Encounter: Payer: Self-pay | Admitting: Gynecology

## 2019-09-13 IMAGING — US US ABDOMEN COMPLETE
1 series · 13 of 25 positions shown · non-contrast
Comparison: CT from 10/22/2013.

CLINICAL DATA: Right upper quadrant pain for 2 months

EXAM:
ABDOMEN ULTRASOUND COMPLETE

[Series 1: us abdomen complete · 0.22mm/px · 13 of 96 slices shown]
[im 1/96]
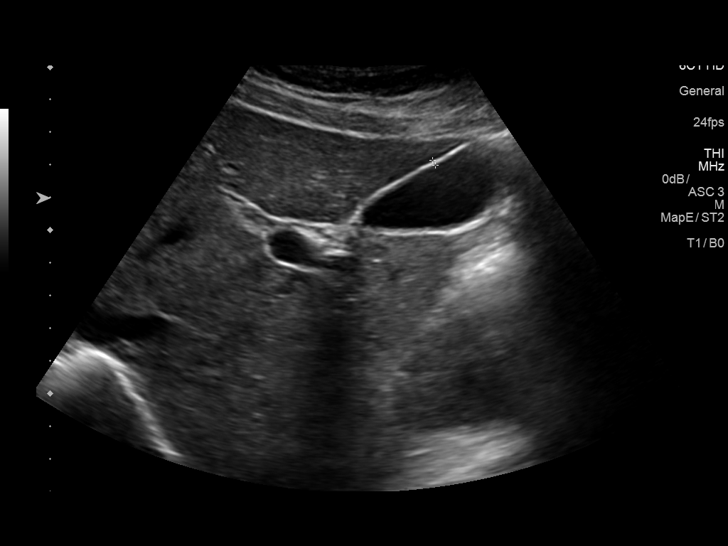
[im 8/96]
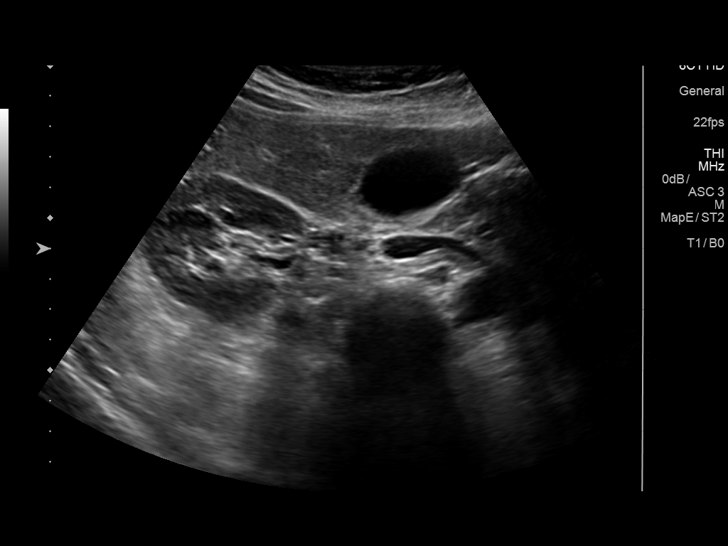
[im 16/96]
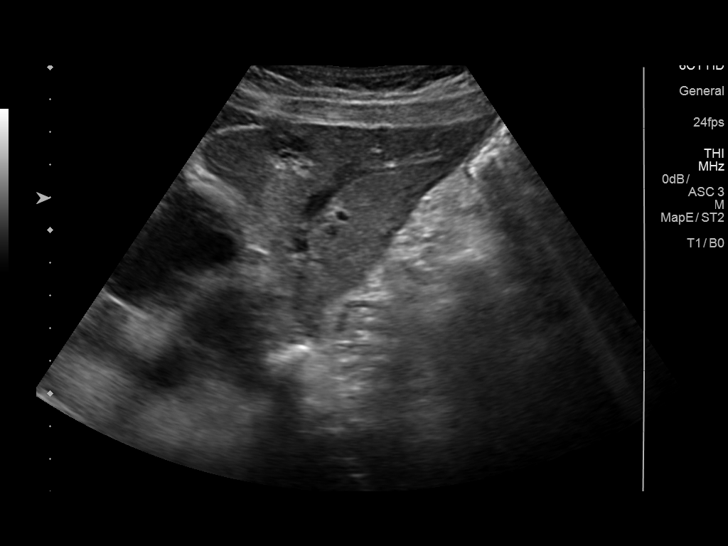
[im 24/96]
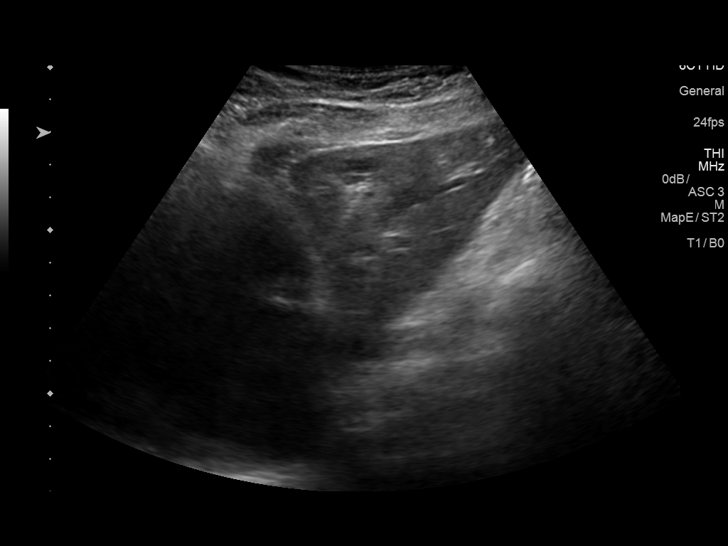
[im 32/96]
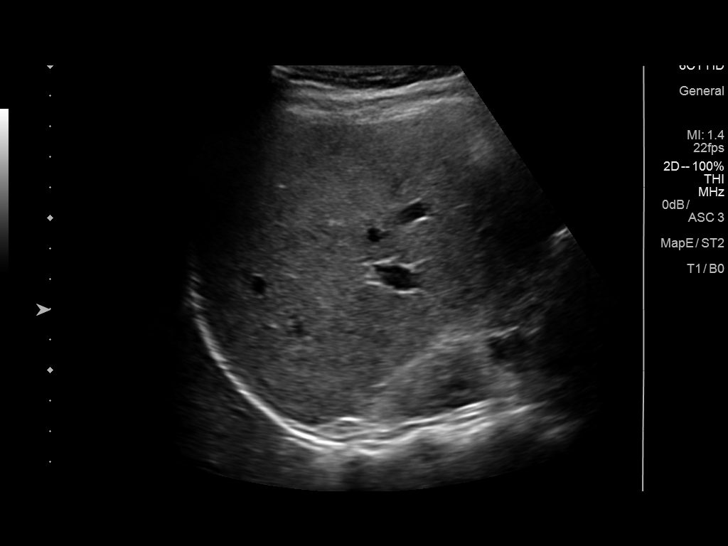
[im 40/96]
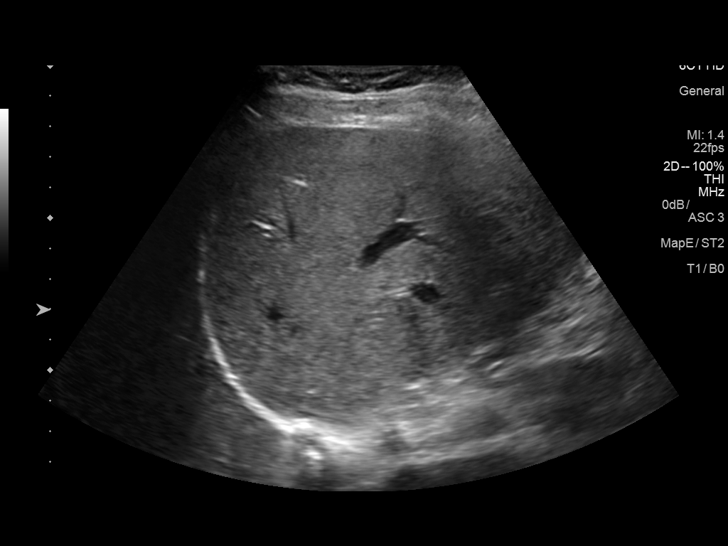
[im 48/96]
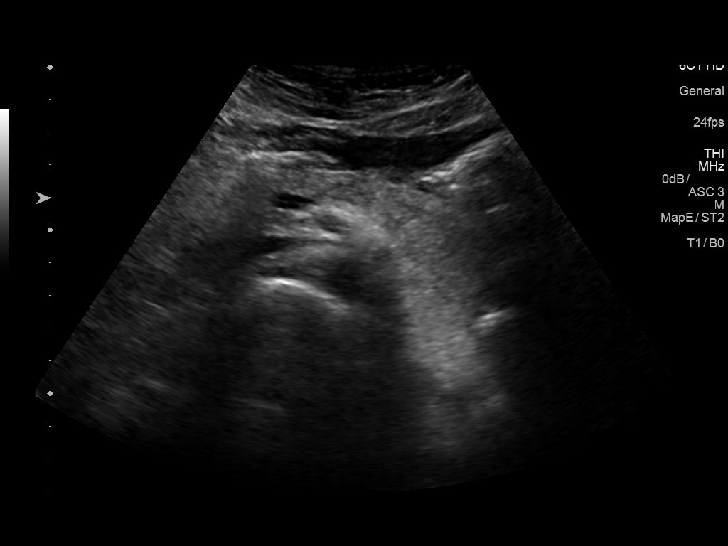
[im 56/96]
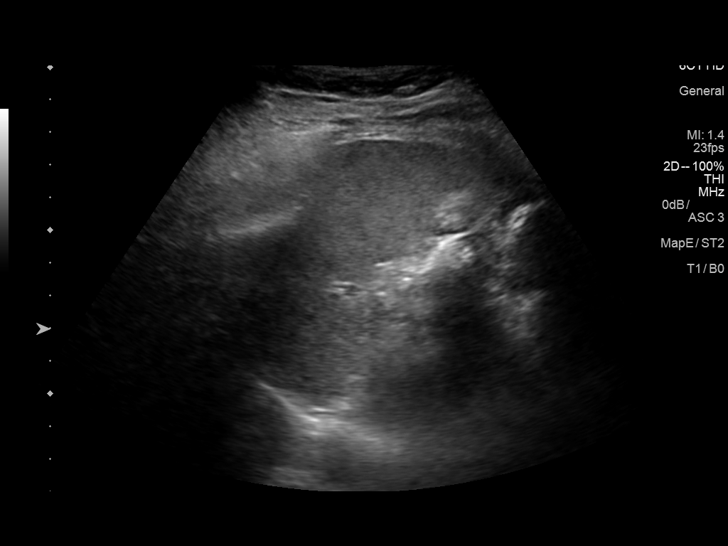
[im 64/96]
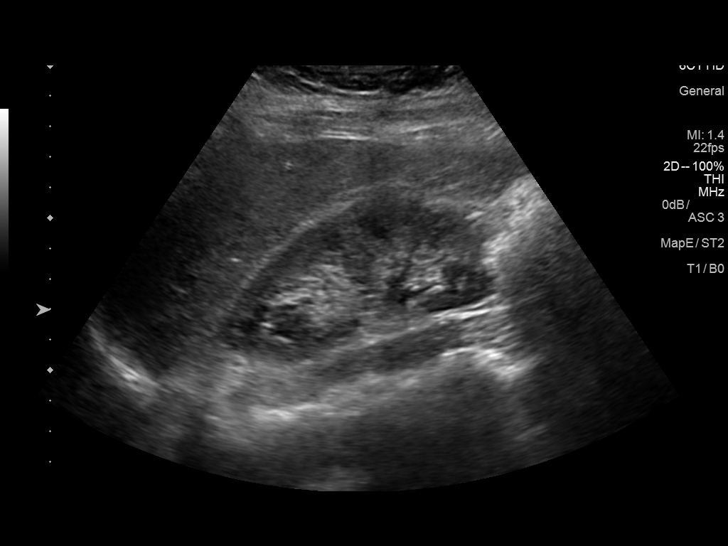
[im 72/96]
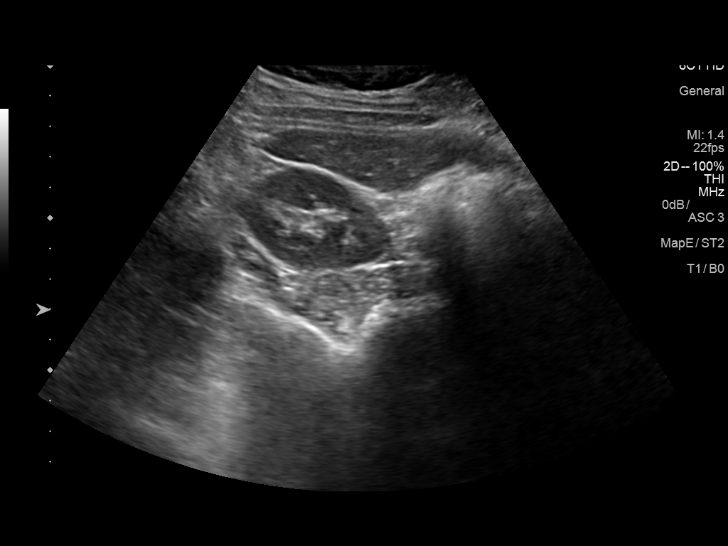
[im 80/96]
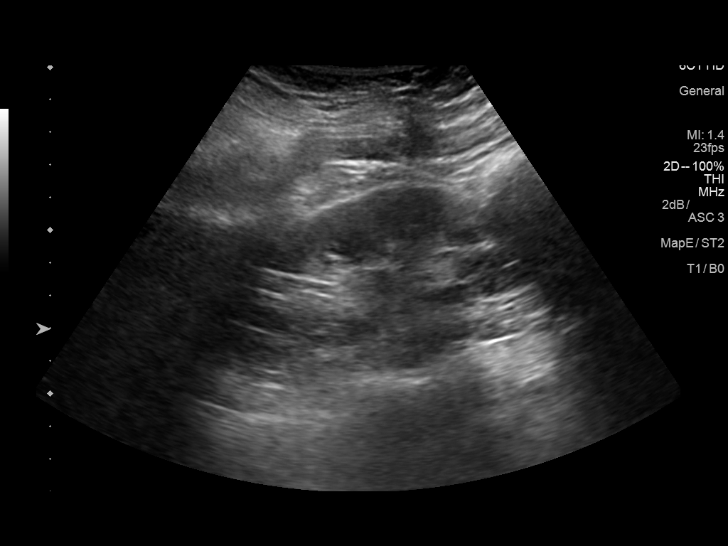
[im 88/96]
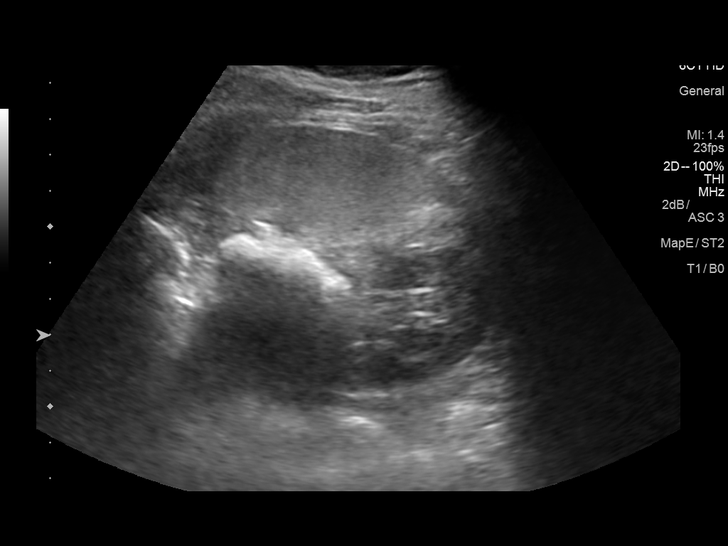
[im 96/96]
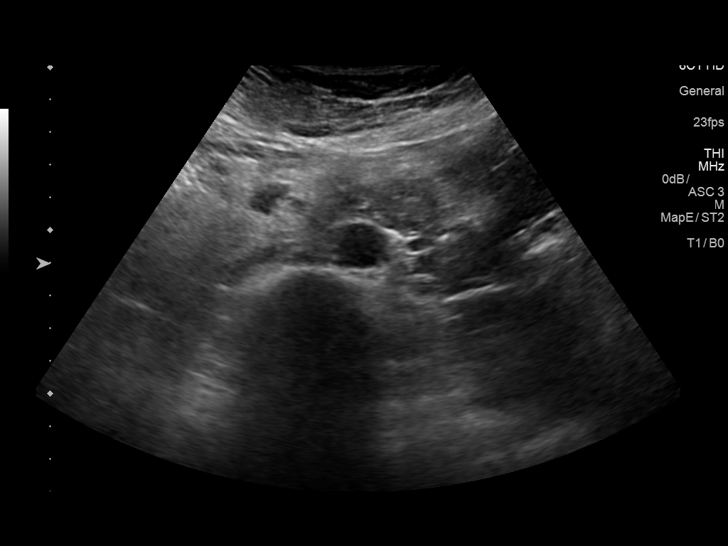

[13 of 25 positions shown; findings below may reference images not displayed]

FINDINGS: Gallbladder: No gallstones or wall thickening visualized. No
sonographic Murphy sign noted by sonographer.

Common bile duct: Diameter: 3.4 mm

Liver: A focal hypoechoic lesion measuring 1 cm in greatest
dimension is noted in the left lobe of the liver. This is slightly
larger but corresponds to a previously seen 6 mm cystic lesion
within the left lobe of the liver. No other focal abnormality is
noted. Portal vein is patent on color Doppler imaging with normal
direction of blood flow towards the liver.

IVC: No abnormality visualized.

Pancreas: Visualized portion unremarkable.

Spleen: Size and appearance within normal limits.

Right Kidney: Length: 10 cm. Echogenicity within normal limits. No
mass or hydronephrosis visualized.

Left Kidney: Length: 11 cm. Echogenicity within normal limits. No
mass or hydronephrosis visualized.

Abdominal aorta: No aneurysm visualized.

Other findings: None.
IMPRESSION: Cystic appearing lesion within the left lobe of the liver. This
likely represents some growth from a prior CT examination from 2263.
Short follow-up in 6 months is recommended to assess for stability.

No other focal abnormality is noted.

## 2019-12-19 ENCOUNTER — Encounter: Payer: Self-pay | Admitting: Women's Health

## 2019-12-25 DIAGNOSIS — C4490 Unspecified malignant neoplasm of skin, unspecified: Secondary | ICD-10-CM | POA: Insufficient documentation

## 2020-02-15 ENCOUNTER — Telehealth: Payer: Self-pay | Admitting: *Deleted

## 2020-02-15 DIAGNOSIS — R011 Cardiac murmur, unspecified: Secondary | ICD-10-CM

## 2020-02-15 NOTE — Telephone Encounter (Addendum)
Patient called had seen Dr. Ellyn Hack (cardiologist) several years ago for heart murmur, states his office told her to come back in 5 years. She called to schedule appointment and states they told her a new referral will be needed. Per Dr.Fernandez note "  Dr. Ellyn Hack for possible mitral valve prolapse and past history of palpitations".   Annual scheduled with you on 07/17/20  Okay to place referral?

## 2020-02-15 NOTE — Telephone Encounter (Addendum)
Referral placed, unable to relay to patient as her voicemail is full.

## 2020-02-15 NOTE — Telephone Encounter (Signed)
Yes, thanks

## 2020-02-19 NOTE — Telephone Encounter (Signed)
Patient informed on 02/08/20

## 2020-02-21 ENCOUNTER — Encounter: Payer: Self-pay | Admitting: Cardiology

## 2020-02-21 ENCOUNTER — Ambulatory Visit (INDEPENDENT_AMBULATORY_CARE_PROVIDER_SITE_OTHER): Payer: PRIVATE HEALTH INSURANCE | Admitting: Cardiology

## 2020-02-21 ENCOUNTER — Encounter: Payer: Self-pay | Admitting: *Deleted

## 2020-02-21 ENCOUNTER — Other Ambulatory Visit: Payer: Self-pay

## 2020-02-21 VITALS — BP 106/62 | HR 96 | Ht 61.0 in | Wt 135.8 lb

## 2020-02-21 DIAGNOSIS — R002 Palpitations: Secondary | ICD-10-CM | POA: Diagnosis not present

## 2020-02-21 DIAGNOSIS — I341 Nonrheumatic mitral (valve) prolapse: Secondary | ICD-10-CM | POA: Diagnosis not present

## 2020-02-21 NOTE — Progress Notes (Signed)
Patient ID: Renee Ochoa, female   DOB: 09/16/1966, 54 y.o.   MRN: [id number] Patient enrolled for [employer] to mail a 7 day ZIO XT long term holter monitor to her  home.

## 2020-02-21 NOTE — Patient Instructions (Signed)
Medication Instructions:  CONTINUE WITH CURRENT MEDICATIONS. NO CHANGES.  *If you need a refill on your cardiac medications before your next appointment, please call your pharmacy*     Testing/Procedures: Your physician has requested that you have an echocardiogram. Echocardiography is a painless test that uses sound waves to create images of your heart. It provides your doctor with information about the size and shape of your heart and how well your heart's chambers and valves are working. This procedure takes approximately one hour. There are no restrictions for this procedure.  [place] Monitor Instructions   Your physician has requested you wear your ZIO patch monitor_7__days.   This is a single patch monitor.  Irhythm supplies one patch monitor per enrollment.  Additional stickers are not available.   Please do not apply patch if you will be having a Nuclear Stress Test, Echocardiogram, Cardiac CT, MRI, or Chest Xray during the time frame you would be wearing the monitor. The patch cannot be worn during these tests.  You cannot remove and re-apply the ZIO XT patch monitor.   Your ZIO patch monitor will be sent USPS Priority mail from [facility] directly to your home address. The monitor may also be mailed to a PO BOX if home delivery is not available.   It may take 3-5 days to receive your monitor after you have been enrolled.   Once you have received you monitor, please review enclosed instructions.  Your monitor has already been registered assigning a specific monitor serial # to you.   Applying the monitor   Shave hair from upper left chest.   Hold abrader disc by orange tab.  Rub abrader in 40 strokes over left upper chest as indicated in your monitor instructions.   Clean area with 4 enclosed alcohol pads .  Use all pads to assure are is cleaned thoroughly.  Let dry.   Apply patch as indicated in monitor instructions.  Patch will  be place under collarbone on left side of chest with arrow pointing upward.   Rub patch adhesive wings for 2 minutes.Remove white label marked "1".  Remove white label marked "2".  Rub patch adhesive wings for 2 additional minutes.   While looking in a mirror, press and release button in center of patch.  A small green light will flash 3-4 times .  This will be your only indicator the monitor has been turned on.     Do not shower for the first 24 hours.  You may shower after the first 24 hours.   Press button if you feel a symptom. You will hear a small click.  Record Date, Time and Symptom in the Patient Log Book.   When you are ready to remove patch, follow instructions on last 2 pages of Patient Log Book.  Stick patch monitor onto last page of Patient Log Book.   Place Patient Log Book in [place] box.  Use locking tab on box and tape box closed securely.  The [employer] and [employer] has [employer] on it.  Please place in mailbox as soon as possible.  Your physician should have your test results approximately 7 days after the monitor has been mailed back to [facility].   Call [place] at [phone] if you have questions regarding your ZIO XT patch monitor.  Call them immediately if you see an orange light blinking on your monitor.   If your monitor falls off  in less than 4 days contact our Monitor department at [phone].  If your monitor becomes loose or falls off after 4 days call [employer] at [phone] for suggestions on securing your monitor.     Follow-Up: At [facility], you and your health needs are our priority.  As part of our continuing mission to provide you with exceptional heart care, we have created designated Provider Care Teams.  These Care Teams include your primary Cardiologist (physician) and Advanced Practice Providers (APPs -  Physician Assistants and Nurse Practitioners) who all work together to provide you with the care you need, when  you need it.  We recommend signing up for the patient portal called "MyChart".  Sign up information is provided on this After Visit Summary.  MyChart is used to connect with patients for Virtual Visits (Telemedicine).  Patients are able to view lab/test results, encounter notes, upcoming appointments, etc.  Non-urgent messages can be sent to your provider as well.   To learn more about what you can do with MyChart, go to [email or web address].    Your next appointment:   3 month(s)  The format for your next appointment:   In Person  Provider:   Oswaldo Milian, MD

## 2020-02-21 NOTE — Progress Notes (Signed)
Cardiology Office Note:    Date:  02/22/2020   ID:  Renee Ochoa, DOB Jul 25, 1966, MRN [id number]  PCP:  Leonard Downing, MD  Cardiologist:  No primary care provider on file.  Electrophysiologist:  None   Referring MD: Joseph Pierini, MD   Chief Complaint  Patient presents with  . Palpitations    History of Present Illness:    Renee Ochoa is a 54 y.o. female with a hx of possible mitral valve prolapse who is referred by Dr. Delilah Shan for evaluation of heart murmur.  Patient reports that she previously followed with cardiology but has not been seen for years.  Had TTE done in December 2000 which showed EF 50%, elongated mitral valve leaflets with trivial MR, trivial AI, trivial to mild TR.  She reports that she had been doing well until she received her Covid vaccine on March 6.  States that since that time she has felt like her heart is racing.  States that her heart rate will spike to 140s with minimal exertion, has also had a few episodes where her heart is racing at rest.  Normally is very active and can hike for 10 to 12 miles, and denies any exertional chest pain or dyspnea.   Past Medical History:  Diagnosis Date  . ADD (attention deficit disorder)   . Arthritis   . Colon polyp   . Menstrual migraine   . PONV (postoperative nausea and vomiting)     Past Surgical History:  Procedure Laterality Date  . BREAST LUMPECTOMY     RIGHT  . LAPAROSCOPIC APPENDECTOMY N/A 10/23/2013   Procedure: APPENDECTOMY LAPAROSCOPIC;  Surgeon: Haywood Lasso, MD;  Location: [place];  Service: General;  Laterality: N/A;  . RESECTOSCOPIC POLYPECTOMY, ENDOMYORESECTION, ENDOMETRIAL ABLATION  07/2001  . UMBILICAL HERNIA REPAIR      Current Medications: Current Meds  Medication Sig  . Loratadine (CLARITIN PO) Take by mouth.  . MELATONIN ER PO Take by mouth.  . Polyethylene Glycol 3350 (MIRALAX PO) Take by mouth.  Marland Kitchen VITAMIN D PO Take by mouth.  Marland Kitchen VITAMIN E PO Take by mouth.      Allergies:   Fexofenadine hcl and Covid-19 mrna vacc (moderna)   Social History   Socioeconomic History  . Marital status: Divorced    Spouse name: Not on file  . Number of children: Not on file  . Years of education: Not on file  . Highest education level: Not on file  Occupational History  . Not on file  Tobacco Use  . Smoking status: Never Smoker  . Smokeless tobacco: Never Used  Substance and Sexual Activity  . Alcohol use: Yes    Alcohol/week: 2.0 standard drinks    Types: 2 Standard drinks or equivalent per week  . Drug use: No  . Sexual activity: Yes    Comment: 1st intercourse 22 yo-5 partners-Vasectomy  Other Topics Concern  . Not on file  Social History Narrative  . Not on file   Social Determinants of Health   Financial Resource Strain:   . Difficulty of Paying Living Expenses:   Food Insecurity:   . Worried About Charity fundraiser in the Last Year:   . Arboriculturist in the Last Year:   Transportation Needs:   . Film/video editor (Medical):   Marland Kitchen Lack of Transportation (Non-Medical):   Physical Activity:   . Days of Exercise per Week:   . Minutes of Exercise per Session:  Stress:   . Feeling of Stress :   Social Connections:   . Frequency of Communication with Friends and Family:   . Frequency of Social Gatherings with Friends and Family:   . Attends Religious Services:   . Active Member of Clubs or Organizations:   . Attends Archivist Meetings:   Marland Kitchen Marital Status:      Family History: The patient's family history includes Breast cancer (age of onset: 10) in her maternal grandmother; Dementia in her father and mother; Heart attack in her father; Hypertension in her father and paternal grandfather; Ulcerative colitis in her son.  ROS:   Please see the history of present illness.     All other systems reviewed and are negative.  EKGs/Labs/Other Studies Reviewed:    The following studies were reviewed today:   EKG:  EKG is  ordered today.  The ekg ordered today demonstrates normal sinus rhythm, rate 96 left atrial enlargement, no ST/T abnormalities  Recent Labs: 07/17/2019: ALT 10; BUN 14; Creat 0.84; Hemoglobin 14.0; Platelets 280; Potassium 4.1; Sodium 140; TSH 1.44  Recent Lipid Panel    Component Value Date/Time   CHOL 229 (H) 07/17/2019 0927   TRIG 68 07/17/2019 0927   HDL 74 07/17/2019 0927   CHOLHDL 3.1 07/17/2019 0927   VLDL 17 07/08/2017 1540   LDLCALC 139 (H) 07/17/2019 0927    Physical Exam:    VS:  BP 106/62   Pulse 96   Ht 5\' 1"  (1.549 m)   Wt 135 lb 12.8 oz (61.6 kg)   LMP 10/01/2016   BMI 25.66 kg/m     Wt Readings from Last 3 Encounters:  02/21/20 135 lb 12.8 oz (61.6 kg)  07/17/19 134 lb (60.8 kg)  07/15/18 133 lb (60.3 kg)     GEN:  Well nourished, well developed in no acute distress HEENT: Normal NECK: No JVD LYMPHATICS: No lymphadenopathy CARDIAC: RRR, no murmurs, rubs, gallops RESPIRATORY:  Clear to auscultation without rales, wheezing or rhonchi  ABDOMEN: Soft, non-tender, non-distended MUSCULOSKELETAL:  No edema; No deformity  SKIN: Warm and dry NEUROLOGIC:  Alert and oriented x 3 PSYCHIATRIC:  Normal affect   ASSESSMENT:    1. Palpitations   2. Mitral valve prolapse    PLAN:    In order of problems listed above:  Palpitations: Description concerning for arrhythmia, will check Zio patch x7 days  Possible mitral valve prolapse: Trivial MR on TTE in 2000.  Will check TTE  RTC in 3 months  Medication Adjustments/Labs and Tests Ordered: Current medicines are reviewed at length with the patient today.  Concerns regarding medicines are outlined above.  Orders Placed This Encounter  Procedures  . LONG TERM MONITOR (3-14 DAYS)  . EKG 12-Lead  . ECHOCARDIOGRAM COMPLETE   No orders of the defined types were placed in this encounter.   Patient Instructions  Medication Instructions:  CONTINUE WITH CURRENT MEDICATIONS. NO CHANGES.  *If you need a refill  on your cardiac medications before your next appointment, please call your pharmacy*     Testing/Procedures: Your physician has requested that you have an echocardiogram. Echocardiography is a painless test that uses sound waves to create images of your heart. It provides your doctor with information about the size and shape of your heart and how well your heart's chambers and valves are working. This procedure takes approximately one hour. There are no restrictions for this procedure.  [place] [facility] Monitor Instructions  Your physician has requested you wear your ZIO patch monitor_7__days.   This is a single patch monitor.  Ir[employer] supplies one patch monitor per enrollment.  Additional stickers are not available.   Please do not apply patch if you will be having a Nuclear Stress Test, Echocardiogram, Cardiac CT, MRI, or Chest Xray during the time frame you would be wearing the monitor. The patch cannot be worn during these tests.  You cannot remove and re-apply the ZIO XT patch monitor.   Your ZIO patch monitor will be sent USPS Priority mail from [facility] directly to your home address. The monitor may also be mailed to a PO BOX if home delivery is not available.   It may take 3-5 days to receive your monitor after you have been enrolled.   Once you have received you monitor, please review enclosed instructions.  Your monitor has already been registered assigning a specific monitor serial # to you.   Applying the monitor   Shave hair from upper left chest.   Hold abrader disc by orange tab.  Rub abrader in 40 strokes over left upper chest as indicated in your monitor instructions.   Clean area with 4 enclosed alcohol pads .  Use all pads to assure are is cleaned thoroughly.  Let dry.   Apply patch as indicated in monitor instructions.  Patch will be place under collarbone on left side of chest with arrow pointing upward.   Rub patch adhesive  wings for 2 minutes.Remove white label marked "1".  Remove white label marked "2".  Rub patch adhesive wings for 2 additional minutes.   While looking in a mirror, press and release button in center of patch.  A small green light will flash 3-4 times .  This will be your only indicator the monitor has been turned on.     Do not shower for the first 24 hours.  You may shower after the first 24 hours.   Press button if you feel a symptom. You will hear a small click.  Record Date, Time and Symptom in the Patient Log Book.   When you are ready to remove patch, follow instructions on last 2 pages of Patient Log Book.  Stick patch monitor onto last page of Patient Log Book.   Place Patient Log Book in [place] box.  Use locking tab on box and tape box closed securely.  The [employer] and [employer] has [employer] on it.  Please place in mailbox as soon as possible.  Your physician should have your test results approximately 7 days after the monitor has been mailed back to [facility].   Call [place] at [phone] if you have questions regarding your ZIO XT patch monitor.  Call them immediately if you see an orange light blinking on your monitor.   If your monitor falls off in less than 4 days contact our Monitor department at [phone].  If your monitor becomes loose or falls off after 4 days call [employer] at [phone] for suggestions on securing your monitor.     Follow-Up: At [facility], you and your health needs are our priority.  As part of our continuing mission to provide you with exceptional heart care, we have created designated Provider Care Teams.  These Care Teams include your primary Cardiologist (physician) and Advanced Practice Providers (APPs -  Physician Assistants and Nurse Practitioners) who all work together to provide you with the care you need, when you need it.  We recommend signing up for the patient portal called "MyChart".  Sign up  information is provided on this After Visit Summary.  MyChart is used to connect with patients for Virtual Visits (Telemedicine).  Patients are able to view lab/test results, encounter notes, upcoming appointments, etc.  Non-urgent messages can be sent to your provider as well.   To learn more about what you can do with MyChart, go to [email or web address].    Your next appointment:   3 month(s)  The format for your next appointment:   In Person  Provider:   Oswaldo Milian, MD       Signed, Donato Heinz, MD  02/22/2020 6:03 PM    [place]

## 2020-02-24 ENCOUNTER — Ambulatory Visit (INDEPENDENT_AMBULATORY_CARE_PROVIDER_SITE_OTHER): Payer: PRIVATE HEALTH INSURANCE

## 2020-02-24 DIAGNOSIS — R002 Palpitations: Secondary | ICD-10-CM | POA: Diagnosis not present

## 2020-02-27 ENCOUNTER — Other Ambulatory Visit (HOSPITAL_COMMUNITY): Payer: PRIVATE HEALTH INSURANCE

## 2020-03-11 ENCOUNTER — Other Ambulatory Visit: Payer: Self-pay

## 2020-03-11 ENCOUNTER — Ambulatory Visit (HOSPITAL_COMMUNITY): Payer: PRIVATE HEALTH INSURANCE | Attending: Cardiovascular Disease

## 2020-03-11 DIAGNOSIS — R002 Palpitations: Secondary | ICD-10-CM | POA: Insufficient documentation

## 2020-05-15 ENCOUNTER — Ambulatory Visit (INDEPENDENT_AMBULATORY_CARE_PROVIDER_SITE_OTHER): Payer: PRIVATE HEALTH INSURANCE | Admitting: Cardiology

## 2020-05-15 ENCOUNTER — Other Ambulatory Visit: Payer: Self-pay

## 2020-05-15 ENCOUNTER — Encounter: Payer: Self-pay | Admitting: Cardiology

## 2020-05-15 VITALS — BP 102/76 | HR 87 | Ht 61.0 in | Wt 128.0 lb

## 2020-05-15 DIAGNOSIS — I341 Nonrheumatic mitral (valve) prolapse: Secondary | ICD-10-CM | POA: Diagnosis not present

## 2020-05-15 DIAGNOSIS — R002 Palpitations: Secondary | ICD-10-CM

## 2020-05-15 DIAGNOSIS — R079 Chest pain, unspecified: Secondary | ICD-10-CM | POA: Diagnosis not present

## 2020-05-15 NOTE — Progress Notes (Signed)
Cardiology Office Note:    Date:  05/15/2020   ID:  Renee Ochoa, DOB 27-Jun-1966, MRN [id number]  PCP:  Leonard Downing, MD  Cardiologist:  No primary care provider on file.  Electrophysiologist:  None   Referring MD: Leonard Downing, *   No chief complaint on file.   History of Present Illness:    Renee Ochoa is a 54 y.o. female with a hx of possible mitral valve prolapse who presents for follow-up.  She was referred by Dr. Delilah Shan for evaluation of heart murmur, initially seen on 02/21/2020.  Patient reports that she previously followed with cardiology but has not been seen for years.  Had TTE done in December 2000 which showed EF 50%, elongated mitral valve leaflets with trivial MR, trivial AI, trivial to mild TR.  She reports that she had been doing well until she received her Covid vaccine on March 6.  States that since that time she has felt like her heart is racing.  States that her heart rate will spike to 140s with minimal exertion, has also had a few episodes where her heart is racing at rest.  Normally is very active and can hike for 10 to 12 miles, and denies any exertional chest pain or dyspnea.  TTE on 03/11/2020 showed EF 26-37%, grade 1 diastolic dysfunction, normal RV function, trivial mitral regurgitation.  Zio patch x 7 days on 03/14/2020 showed no significant arrhythmias, triggered events corresponded to sinus rhythm.  Since last clinic visit, she reports improvement in her palpitations.  Now occurring about every 1 to 2 weeks, last few seconds and resolves.  She denies any dyspnea or lower extremity edema.  She does report over the weekend she had an episode of chest pain.  States that it lasted about 5 minutes.  She was watching TV and felt pain in her left upper chest.  Reports that she is exercising regularly, she will walk/hike for 3 to 10 miles 4-5 times per week.  Has also been using resistance bands for 30 to 45 minutes several times a week.  She denies  any exertional chest pain.  Past Medical History:  Diagnosis Date  . ADD (attention deficit disorder)   . Arthritis   . Colon polyp   . Menstrual migraine   . PONV (postoperative nausea and vomiting)     Past Surgical History:  Procedure Laterality Date  . BREAST LUMPECTOMY     RIGHT  . LAPAROSCOPIC APPENDECTOMY N/A 10/23/2013   Procedure: APPENDECTOMY LAPAROSCOPIC;  Surgeon: Haywood Lasso, MD;  Location: [facility];  Service: General;  Laterality: N/A;  . RESECTOSCOPIC POLYPECTOMY, ENDOMYORESECTION, ENDOMETRIAL ABLATION  07/2001  . UMBILICAL HERNIA REPAIR      Current Medications: Current Meds  Medication Sig  . Loratadine (CLARITIN PO) Take by mouth.  . MELATONIN ER PO Take by mouth.  . Polyethylene Glycol 3350 (MIRALAX PO) Take by mouth.  Marland Kitchen VITAMIN D PO Take by mouth.  Marland Kitchen VITAMIN E PO Take by mouth.     Allergies:   Fexofenadine hcl and Covid-19 mrna vacc (moderna)   Social History   Socioeconomic History  . Marital status: Divorced    Spouse name: Not on file  . Number of children: Not on file  . Years of education: Not on file  . Highest education level: Not on file  Occupational History  . Not on file  Tobacco Use  . Smoking status: Never Smoker  . Smokeless tobacco: Never Used  Vaping  Use  . Vaping Use: Never used  Substance and Sexual Activity  . Alcohol use: Yes    Alcohol/week: 2.0 standard drinks    Types: 2 Standard drinks or equivalent per week  . Drug use: No  . Sexual activity: Yes    Comment: 1st intercourse 22 yo-5 partners-Vasectomy  Other Topics Concern  . Not on file  Social History Narrative  . Not on file   Social Determinants of Health   Financial Resource Strain:   . Difficulty of Paying Living Expenses:   Food Insecurity:   . Worried About Charity fundraiser in the Last Year:   . Arboriculturist in the Last Year:   Transportation Needs:   . Film/video editor (Medical):   Marland Kitchen Lack of Transportation (Non-Medical):    Physical Activity:   . Days of Exercise per Week:   . Minutes of Exercise per Session:   Stress:   . Feeling of Stress :   Social Connections:   . Frequency of Communication with Friends and Family:   . Frequency of Social Gatherings with Friends and Family:   . Attends Religious Services:   . Active Member of Clubs or Organizations:   . Attends Archivist Meetings:   Marland Kitchen Marital Status:      Family History: The patient's family history includes Breast cancer (age of onset: 52) in her maternal grandmother; Dementia in her father and mother; Heart attack in her father; Hypertension in her father and paternal grandfather; Ulcerative colitis in her son.  ROS:   Please see the history of present illness.     All other systems reviewed and are negative.  EKGs/Labs/Other Studies Reviewed:    The following studies were reviewed today:   EKG:  EKG is not ordered today.  The ekg ordered most recently demonstrates normal sinus rhythm, rate 96 left atrial enlargement, no ST/T abnormalities  Recent Labs: 07/17/2019: ALT 10; BUN 14; Creat 0.84; Hemoglobin 14.0; Platelets 280; Potassium 4.1; Sodium 140; TSH 1.44  Recent Lipid Panel    Component Value Date/Time   CHOL 229 (H) 07/17/2019 0927   TRIG 68 07/17/2019 0927   HDL 74 07/17/2019 0927   CHOLHDL 3.1 07/17/2019 0927   VLDL 17 07/08/2017 1540   LDLCALC 139 (H) 07/17/2019 0927    Physical Exam:    VS:  BP 102/76   Pulse 87   Ht 5\' 1"  (1.549 m)   Wt 128 lb (58.1 kg)   LMP 10/01/2016   SpO2 98%   BMI 24.19 kg/m     Wt Readings from Last 3 Encounters:  05/15/20 128 lb (58.1 kg)  02/21/20 135 lb 12.8 oz (61.6 kg)  07/17/19 134 lb (60.8 kg)     GEN:  Well nourished, well developed in no acute distress HEENT: Normal NECK: No JVD LYMPHATICS: No lymphadenopathy CARDIAC: RRR, no murmurs, rubs, gallops RESPIRATORY:  Clear to auscultation without rales, wheezing or rhonchi  ABDOMEN: Soft, non-tender,  non-distended MUSCULOSKELETAL:  No edema; No deformity  SKIN: Warm and dry NEUROLOGIC:  Alert and oriented x 3 PSYCHIATRIC:  Normal affect   ASSESSMENT:    1. Palpitations   2. Mitral valve prolapse   3. Chest pain of uncertain etiology    PLAN:     Palpitations: Zio patch x 7 days on 03/14/2020 showed no significant arrhythmias, triggered events corresponded to sinus rhythm.  Possible mitral valve prolapse: Trivial MR on TTE in 2000.  TTE 03/11/2020 show shows normal  mitral valve with trivial mitral regurgitation.  Chest pain: Description suggest noncardiac pain.  No relationship with exertion.  RTC in 1 year  Medication Adjustments/Labs and Tests Ordered: Current medicines are reviewed at length with the patient today.  Concerns regarding medicines are outlined above.  No orders of the defined types were placed in this encounter.  No orders of the defined types were placed in this encounter.   Patient Instructions  Medication Instructions:  Your physician recommends that you continue on your current medications as directed. Please refer to the Current Medication list given to you today.  Follow-Up: At [facility], you and your health needs are our priority.  As part of our continuing mission to provide you with exceptional heart care, we have created designated Provider Care Teams.  These Care Teams include your primary Cardiologist (physician) and Advanced Practice Providers (APPs -  Physician Assistants and Nurse Practitioners) who all work together to provide you with the care you need, when you need it.  We recommend signing up for the patient portal called "MyChart".  Sign up information is provided on this After Visit Summary.  MyChart is used to connect with patients for Virtual Visits (Telemedicine).  Patients are able to view lab/test results, encounter notes, upcoming appointments, etc.  Non-urgent messages can be sent to your provider as well.   To learn more about  what you can do with MyChart, go to [email or web address].    Your next appointment:   12 month(s)  The format for your next appointment:   In Person  Provider:   Oswaldo Milian, MD         Signed, Donato Heinz, MD  05/15/2020 9:57 AM    [place]

## 2020-05-15 NOTE — Patient Instructions (Signed)

## 2020-07-17 ENCOUNTER — Other Ambulatory Visit: Payer: Self-pay

## 2020-07-17 ENCOUNTER — Encounter: Payer: Self-pay | Admitting: Obstetrics and Gynecology

## 2020-07-17 ENCOUNTER — Ambulatory Visit (INDEPENDENT_AMBULATORY_CARE_PROVIDER_SITE_OTHER): Payer: PRIVATE HEALTH INSURANCE | Admitting: Obstetrics and Gynecology

## 2020-07-17 VITALS — BP 120/74 | Ht 62.0 in | Wt 130.0 lb

## 2020-07-17 DIAGNOSIS — Z01419 Encounter for gynecological examination (general) (routine) without abnormal findings: Secondary | ICD-10-CM | POA: Diagnosis not present

## 2020-07-17 DIAGNOSIS — Z13220 Encounter for screening for lipoid disorders: Secondary | ICD-10-CM

## 2020-07-17 DIAGNOSIS — R7989 Other specified abnormal findings of blood chemistry: Secondary | ICD-10-CM | POA: Diagnosis not present

## 2020-07-17 NOTE — Progress Notes (Signed)
   Renee Ochoa [birth year] [id number]  SUBJECTIVE:  54 y.o. G2P2 female for annual routine gynecologic exam. She has no gynecologic concerns.  Current Outpatient Medications  Medication Sig Dispense Refill  . COLLAGEN PO Take by mouth.    . Loratadine (CLARITIN PO) Take by mouth.    . MELATONIN ER PO Take by mouth.    . Polyethylene Glycol 3350 (MIRALAX PO) Take by mouth.    Marland Kitchen VITAMIN D PO Take by mouth.    Marland Kitchen VITAMIN E PO Take by mouth.     No current facility-administered medications for this visit.   Allergies: Fexofenadine hcl and Covid-19 mrna vacc (moderna)  Patient's last menstrual period was 10/01/2016.  Past medical history,surgical history, problem list, medications, allergies, family history and social history were all reviewed and documented as reviewed in the EPIC chart.  ROS:  Feeling well. No dyspnea or chest pain on exertion.  No abdominal pain, change in bowel habits, black or bloody stools.  No urinary tract symptoms. GYN ROS: no abnormal bleeding, pelvic pain or discharge, no breast pain or new or enlarging lumps on self exam.No neurological complaints.   OBJECTIVE:  BP 120/74   Ht 5\' 2"  (1.575 m)   Wt 130 lb (59 kg)   LMP 10/01/2016   BMI 23.78 kg/m  The patient appears well, alert, oriented x 3, in no distress. ENT normal.  Neck supple. No cervical or supraclavicular adenopathy or thyromegaly.  Lungs are clear, good air entry, no wheezes, rhonchi or rales. S1 and S2 normal, no murmurs, regular rate and rhythm.  Abdomen soft without tenderness, guarding, mass or organomegaly.  Neurological is normal, no focal findings.  BREAST EXAM: breasts appear normal, no suspicious masses, no skin or nipple changes or axillary nodes  PELVIC EXAM: VULVA: normal appearing vulva with no masses, tenderness or lesions, VAGINA: normal appearing vagina with normal color and discharge, no lesions, CERVIX: normal appearing cervix without discharge or lesions, UTERUS: uterus is  normal size, shape, consistency and nontender, ADNEXA: normal adnexa in size, nontender and no masses  Chaperone: Aurora Mask (DNP student) present during the examination and performed the pelvic exam with me in attendance to confirm the exam findings   ASSESSMENT:  54 y.o. G2P2 here for annual gynecologic exam  PLAN:   1. Postmenopausal.  No severe menopausal symptoms.  No vaginal bleeding. 2. Pap smear 2019.  History of cryosurgery 1993 for dysplasia with normal Pap smears since.  Pap smear due 2022 following the current screening guidelines recommending the 3-year interval. 3. Mammogram 11/2019.  Normal breast exam today.  Will continue with mammography every year.   4. Colonoscopy 2019.  Recommended that she follow up at the recommended interval.   5. DEXA never.  Would recommend further in to menopause. 6. Health maintenance.  She will proceed to lab today for routine screening blood work (lipids, CBC, CMP, vitamin D for history of low level).  Return annually or sooner, prn.  Joseph Pierini MD 07/17/20

## 2020-07-18 LAB — COMPREHENSIVE METABOLIC PANEL
AG Ratio: 2.3 (calc) (ref 1.0–2.5)
ALT: 15 U/L (ref 6–29)
AST: 13 U/L (ref 10–35)
Albumin: 4.2 g/dL (ref 3.6–5.1)
Alkaline phosphatase (APISO): 45 U/L (ref 37–153)
BUN: 13 mg/dL (ref 7–25)
CO2: 28 mmol/L (ref 20–32)
Calcium: 9.5 mg/dL (ref 8.6–10.4)
Chloride: 105 mmol/L (ref 98–110)
Creat: 0.8 mg/dL (ref 0.50–1.05)
Globulin: 1.8 g/dL (calc) — ABNORMAL LOW (ref 1.9–3.7)
Glucose, Bld: 91 mg/dL (ref 65–99)
Potassium: 4.6 mmol/L (ref 3.5–5.3)
Sodium: 141 mmol/L (ref 135–146)
Total Bilirubin: 0.6 mg/dL (ref 0.2–1.2)
Total Protein: 6 g/dL — ABNORMAL LOW (ref 6.1–8.1)

## 2020-07-18 LAB — CBC
HCT: 41.5 % (ref 35.0–45.0)
Hemoglobin: 13.6 g/dL (ref 11.7–15.5)
MCH: 28.9 pg (ref 27.0–33.0)
MCHC: 32.8 g/dL (ref 32.0–36.0)
MCV: 88.3 fL (ref 80.0–100.0)
MPV: 10.5 fL (ref 7.5–12.5)
Platelets: 268 10*3/uL (ref 140–400)
RBC: 4.7 10*6/uL (ref 3.80–5.10)
RDW: 12.6 % (ref 11.0–15.0)
WBC: 4.6 10*3/uL (ref 3.8–10.8)

## 2020-07-18 LAB — LIPID PANEL
Cholesterol: 200 mg/dL — ABNORMAL HIGH (ref <200)
HDL: 70 mg/dL (ref >=50)
LDL Cholesterol (Calc): 113 mg/dL (calc) — ABNORMAL HIGH
Non-HDL Cholesterol (Calc): 130 mg/dL (calc) — ABNORMAL HIGH (ref <130)
Total CHOL/HDL Ratio: 2.9 (calc) (ref <5.0)
Triglycerides: 77 mg/dL (ref <150)

## 2020-07-18 LAB — VITAMIN D 25 HYDROXY (VIT D DEFICIENCY, FRACTURES): Vit D, 25-Hydroxy: 48 ng/mL (ref 30–100)

## 2020-07-19 ENCOUNTER — Encounter: Payer: Self-pay | Admitting: Obstetrics and Gynecology

## 2021-04-02 ENCOUNTER — Ambulatory Visit: Payer: Self-pay

## 2021-04-02 ENCOUNTER — Other Ambulatory Visit: Payer: Self-pay

## 2021-04-02 ENCOUNTER — Ambulatory Visit (INDEPENDENT_AMBULATORY_CARE_PROVIDER_SITE_OTHER): Payer: PRIVATE HEALTH INSURANCE | Admitting: Family Medicine

## 2021-04-02 VITALS — BP 108/62 | Ht 60.0 in | Wt 133.0 lb

## 2021-04-02 DIAGNOSIS — M25562 Pain in left knee: Secondary | ICD-10-CM

## 2021-04-02 NOTE — Patient Instructions (Signed)
You have a hamstring strain (specifically of your semimembranosis) Wear compression sleeve when up and walking around for next 6 weeks if tolerated. Aleve 2 tabs twice a day with food OR ibuprofen 600mg  three times a day with food if needed. Icing 15 minutes at a time 3-4 times a day. Do home exercises as directed for next 6 weeks. Consider physical therapy as well if not improving. Follow up with me in 5-6 weeks.

## 2021-04-03 ENCOUNTER — Encounter: Payer: Self-pay | Admitting: Family Medicine

## 2021-04-03 NOTE — Progress Notes (Signed)
PCP: Leonard Downing, MD  Subjective:   HPI: Patient is a 55 y.o. female here for left knee pain.  Patient reports she was hiking in January when she fell directly onto her left knee and twisted this. Some swelling of the knee. Worse when sitting then going to get up. Pain posteromedial left knee. Worse also with knee fully straight. When she was young was diagnosed with JRA but unsure if this was accurate or not.  Past Medical History:  Diagnosis Date  . ADD (attention deficit disorder)   . Arthritis   . Colon polyp   . Menstrual migraine   . PONV (postoperative nausea and vomiting)     Current Outpatient Medications on File Prior to Visit  Medication Sig Dispense Refill  . COLLAGEN PO Take by mouth.    . Loratadine (CLARITIN PO) Take by mouth.    . MELATONIN ER PO Take by mouth.    . Polyethylene Glycol 3350 (MIRALAX PO) Take by mouth.    Marland Kitchen VITAMIN D PO Take by mouth.     No current facility-administered medications on file prior to visit.    Past Surgical History:  Procedure Laterality Date  . BREAST LUMPECTOMY     RIGHT  . LAPAROSCOPIC APPENDECTOMY N/A 10/23/2013   Procedure: APPENDECTOMY LAPAROSCOPIC;  Surgeon: Haywood Lasso, MD;  Location: [facility];  Service: General;  Laterality: N/A;  . RESECTOSCOPIC POLYPECTOMY, ENDOMYORESECTION, ENDOMETRIAL ABLATION  07/2001  . UMBILICAL HERNIA REPAIR      Allergies  Allergen Reactions  . Fexofenadine Hcl Hives  . Covid-19 Mrna Vacc (Moderna) Palpitations and Swelling    Social History   Socioeconomic History  . Marital status: Divorced    Spouse name: Not on file  . Number of children: Not on file  . Years of education: Not on file  . Highest education level: Not on file  Occupational History  . Not on file  Tobacco Use  . Smoking status: Never Smoker  . Smokeless tobacco: Never Used  Vaping Use  . Vaping Use: Never used  Substance and Sexual Activity  . Alcohol use: Yes    Alcohol/week: 2.0 standard  drinks    Types: 2 Standard drinks or equivalent per week  . Drug use: No  . Sexual activity: Yes    Comment: 1st intercourse 22 yo-5 partners-Vasectomy  Other Topics Concern  . Not on file  Social History Narrative  . Not on file   Social Determinants of Health   Financial Resource Strain: Not on file  Food Insecurity: Not on file  Transportation Needs: Not on file  Physical Activity: Not on file  Stress: Not on file  Social Connections: Not on file  Intimate Partner Violence: Not on file    Family History  Problem Relation Age of Onset  . Hypertension Father   . Heart attack Father   . Dementia Father   . Breast cancer Maternal Grandmother 60  . Hypertension Paternal Grandfather   . Dementia Mother   . Ulcerative colitis Son     BP 108/62   Ht 5' (1.524 m)   Wt 133 lb (60.3 kg)   LMP 10/01/2016   BMI 25.97 kg/m   [place] Adult Exercise 04/02/2021  Frequency of aerobic exercise (# of days/week) 3  Average time in minutes 45  Frequency of strengthening activities (# of days/week) 0    No flowsheet data found.  Review of Systems: See HPI above.     Objective:  Physical Exam:  Gen: NAD, comfortable in exam room  Left knee: No gross deformity, ecchymoses, effusion TTP medial hamstring tendon.  No joint line, other tenderness. FROM with normal strength - pain with resisted knee flexion. Negative ant/post drawers. Negative valgus/varus testing. Negative lachman.  Negative mcmurrays, apleys.  NV intact distally.   Limited MSK u/s left knee:  No bakers cyst.  Distal semimembranosus without obvious tear but edema surrounding this.  Assessment & Plan:  1. Left knee pain - 2/2 semimembranosus strain.  Compression, aleve or ibuprofen, icing.  Home exercises reviewed.  Consider physical therapy if not improving.  F/u in 5-6 weeks.

## 2021-05-15 ENCOUNTER — Encounter: Payer: Self-pay | Admitting: Family Medicine

## 2021-05-15 ENCOUNTER — Other Ambulatory Visit: Payer: Self-pay

## 2021-05-15 ENCOUNTER — Ambulatory Visit (INDEPENDENT_AMBULATORY_CARE_PROVIDER_SITE_OTHER): Payer: PRIVATE HEALTH INSURANCE | Admitting: Family Medicine

## 2021-05-15 VITALS — BP 108/55 | Ht 61.0 in | Wt 131.0 lb

## 2021-05-15 DIAGNOSIS — M25562 Pain in left knee: Secondary | ICD-10-CM

## 2021-05-15 NOTE — Patient Instructions (Signed)
Thank you for visiting [place] today!  It looks as though you may have a small medial meniscal tear in your knee.  This was likely clouded by your previous hamstring injury.  Fortunately, most meniscal injuries will heal on their own with time and TLC. -Continue to wear your knee support sleeve. -Perform the quadriceps exercises you were given today in addition to your previous hamstring exercises.  If your hamstring has improved, you may reduce the frequency of these exercises to 2-3 times weekly. -Take ibuprofen or Tylenol as you need to for pain.  You may also ice your knee for 20 minutes at a time. -If you are not improving, return to clinic for reevaluation.  In the future, we could consider referral for in clinic physical therapy, or steroid injections to the knee.

## 2021-05-15 NOTE — Progress Notes (Signed)
Office Visit Note   Patient: Renee Ochoa           Date of Birth: [birth year]           MRN: [id number] Visit Date: 05/15/2021 Requested by: Leonard Downing, MD [address] [place],   [id number] PCP: Leonard Downing, MD  Subjective: CC: Follow-up left knee pain  HPI: 55 year old avid hiker presenting to clinic with concerns of ongoing left knee pain.  Patient states that she fell in January, twisting her leg beneath her during a hike.  She was able to complete the 7 mile hike at that time, but noticed worsening pain and swelling throughout her knee.  Previously, her pain is primarily focused on the posterior aspect, but this pain has significantly improved with previously prescribed hamstring exercises.  Today, she is concerned that her pain seems to have migrated to the medial aspect of her knee, and she endorses a "knocking" within this area when she raises from a seated position. Overall, she feels as though her knee pain is much improved-though it continues to bother her with long hikes, or over extension of the knee (specifically laying flat on her back at night, or when resting one leg over the other).  She denies any overt locking of the knee.  She feels as though her knee has remained swollen ever since the initial injury.  She has no additional concerns today.   Objective: Vital Signs: BP (!) 108/55   Ht 5\' 1"  (1.549 m)   Wt 131 lb (59.4 kg)   LMP 10/01/2016   BMI 24.75 kg/m  [place] Adult Exercise 04/02/2021  Frequency of aerobic exercise (# of days/week) 3  Average time in minutes 45  Frequency of strengthening activities (# of days/week) 0     No flowsheet data found.  Physical Exam:  General:  Alert and oriented, in no acute distress. Pulm:  Breathing unlabored. Psy:  Normal mood, congruent affect. Skin: Left knee without bruises, rashes, or erythema. Overlying skin intact.  Left knee exam: General: Normal gait Standing exam:  No varus or valgus deformity of the knee.   Seated Exam:  Mild patellar crepitus bilaterally. Negative J-Sign.  Full range of motion with flexion and extension of the knee.  Palpation: Endorses tenderness palpation over the medial joint line on the left.  No significant tenderness with patellar compression or along the lateral joint line.  No tenderness within the patellar tendon.  No longer demonstrates pain within the posterior aspect of the hamstring insertion.  Supine exam: Small effusion, normal patellar mobility.   Ligamentous Exam:  No pain or laxity with anterior/posterior drawer.  No obvious Sag.  No pain or laxity with varus/valgus stress across the knee.   Meniscus:  McMurray with pain and subtle click along the medial arc. Thessaly negative today, though she states this exercise was incredibly painful previously.   Strength: Hip flexion (L1), Hip Aduction (L2), Knee Extension (L3) are 5/5 Bilaterally.  No pain with strength testing. Foot Inversion (L4), Dorsiflexion (L5), and Eversion (S1) 5/5 Bilaterally  Sensation: Intact to light touch medial and lateral aspects of lower extremities, and lateral, dorsal, and medial aspects of foot.    Imaging: Extremity Ultrasound-left knee:   Quadriceps tendon visualized in long and short access, fibers intact without obvious defects or evidence of tears.  Demonstrates mild effusion within the suprapatellar pouch.  Patellar Tendon intact, with no intratendinous calcifications or thickening.   Lateral joint space preserved.  Lateral meniscus appears intact, without significant surrounding fluid.  Overlying lateral collateral ligament fibers appear intact.   Medial joint space appears well preserved, however her medial meniscus does appear to extrude during dynamic flexion and extension.  There is a small amount of surrounding fluid in this area.  Overlying medial collateral ligament appears intact.   Impression: Subtle medial meniscus  extrusion upon dynamic testing, with mild suprapatellar effusion.   Assessment & Plan: 55 year old female presenting to clinic with ongoing left knee pain following a twisting fall in January.  Previously patient was diagnosed with a hamstring injury, though she states that her posterior pain has abated with hamstring rehabilitation.  She continues to have pain over the medial joint line, with tenderness in the medial arc of McMurray's as well as subtle extrusion appreciated on dynamic ultrasound. -Concern for small medial meniscal tear, though optimistic this should improve with conservative treatment. -Continue compression sleeve, icing, NSAID therapy for pain. -Added quadricep rehabilitation exercises to help stabilize the knee and encourage meniscal healing -If she fails to notice improvement, would consider referral for formal physical therapy.  Could also consider joint injection. -Not significantly troubled by catching or locking, so we will hold on MRI at this time, though could consider in the future if she fails to progress. -Patient expressed understanding with plan.  She no further questions or concerns today.     Procedures: No procedures performed

## 2021-07-21 ENCOUNTER — Ambulatory Visit: Payer: No Typology Code available for payment source | Admitting: Nurse Practitioner

## 2021-07-22 ENCOUNTER — Other Ambulatory Visit (HOSPITAL_COMMUNITY)
Admission: RE | Admit: 2021-07-22 | Discharge: 2021-07-22 | Disposition: A | Discharge status: Home / Self Care | Payer: No Typology Code available for payment source | Source: Ambulatory Visit | Attending: Nurse Practitioner | Admitting: Nurse Practitioner

## 2021-07-22 ENCOUNTER — Ambulatory Visit (INDEPENDENT_AMBULATORY_CARE_PROVIDER_SITE_OTHER): Payer: No Typology Code available for payment source | Admitting: Nurse Practitioner

## 2021-07-22 ENCOUNTER — Other Ambulatory Visit: Payer: Self-pay

## 2021-07-22 ENCOUNTER — Encounter: Payer: Self-pay | Admitting: Nurse Practitioner

## 2021-07-22 VITALS — BP 114/70 | Ht 60.5 in | Wt 131.0 lb

## 2021-07-22 DIAGNOSIS — E785 Hyperlipidemia, unspecified: Secondary | ICD-10-CM

## 2021-07-22 DIAGNOSIS — Z01419 Encounter for gynecological examination (general) (routine) without abnormal findings: Secondary | ICD-10-CM | POA: Insufficient documentation

## 2021-07-22 DIAGNOSIS — Z780 Asymptomatic menopausal state: Secondary | ICD-10-CM

## 2021-07-22 DIAGNOSIS — Z8639 Personal history of other endocrine, nutritional and metabolic disease: Secondary | ICD-10-CM

## 2021-07-22 DIAGNOSIS — Z8349 Family history of other endocrine, nutritional and metabolic diseases: Secondary | ICD-10-CM

## 2021-07-22 NOTE — Progress Notes (Signed)
   HALLE HOPPS [birth year] [id number]   History:  55 y.o. G2P2 presents for annual exam without GYN complaints. Postmenopausal - no HRT, no bleeding. 1993 cryosurgery, subsequent paps normal.   Gynecologic History Patient's last menstrual period was 10/01/2016.   Contraception/Family planning: post menopausal status Sexually active: Yes  Health Maintenance Last Pap: 07/15/2018. Results were: Normal, 3-year repeat Last mammogram: 11/2020. Results were: Normal per patient Last colonoscopy: 2019. Results were: benign polyps, 5-year recall Last Dexa: Not indicated  Past medical history, past surgical history, family history and social history were all reviewed and documented in the EPIC chart. 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   ROS:  A ROS was performed and pertinent positives and negatives are included.  Exam:  Vitals:   07/22/21 1047  BP: 114/70  Weight: 131 lb (59.4 kg)  Height: 5' 0.5" (1.537 m)   Body mass index is 25.16 kg/m.  General appearance:  Normal Thyroid:  Symmetrical, normal in size, without palpable masses or nodularity. Respiratory  Auscultation:  Clear without wheezing or rhonchi Cardiovascular  Auscultation:  Regular rate, without rubs, murmurs or gallops  Edema/varicosities:  Not grossly evident Abdominal  Soft,nontender, without masses, guarding or rebound.  Liver/spleen:  No organomegaly noted  Hernia:  None appreciated  Skin  Inspection:  Grossly normal Breasts: Examined lying and sitting.   Right: Without masses, retractions, nipple discharge or axillary adenopathy.   Left: Without masses, retractions, nipple discharge or axillary adenopathy. Genitourinary   Inguinal/mons:  Normal without inguinal adenopathy  External genitalia:  Normal appearing vulva with no masses, tenderness, or lesions  BUS/Urethra/Skene's glands:  Normal  Vagina:  Normal appearing with normal color and discharge, no  lesions  Cervix:  Normal appearing without discharge or lesions  Uterus:  Normal in size, shape and contour.  Midline and mobile, nontender  Adnexa/parametria:     Rt: Normal in size, without masses or tenderness.   Lt: Normal in size, without masses or tenderness.  Anus and perineum: Normal  Digital rectal exam: Normal sphincter tone without palpated masses or tenderness  Patient informed chaperone available to be present for breast and pelvic exam. Patient has requested no chaperone to be present. Patient has been advised what will be completed during breast and pelvic exam.   Assessment/Plan:  55 y.o. G2P2 for annual exam.   Well female exam with routine gynecological exam - Plan: CBC with Differential/Platelet, Comprehensive metabolic panel, Cytology - PAP( [place]). Education provided on SBEs, importance of preventative screenings, current guidelines, high calcium diet, regular exercise, and multivitamin daily.   Hyperlipidemia, unspecified hyperlipidemia type - Plan: Lipid panel  Postmenopausal - no HRT, no bleeding.   History of vitamin D deficiency - Plan: VITAMIN D 25 Hydroxy (Vit-D Deficiency, Fractures)  Family history of thyroid disease - Plan: TSH  Screening for cervical cancer - 1993 cryosurgery, subsequent paps normal. Pap due today.   Screening for breast cancer - Normal mammogram history.  Continue annual screenings.  Normal breast exam today.  Screening for colon cancer - 2019 colonoscopy. Will repeat at GI's recommended interval.   Screening for osteoporosis - Will plan Dexa further into menopause.   Return in 1 year for annual.   Tamela Gammon DNP, 11:23 AM 07/22/2021

## 2021-07-23 LAB — COMPREHENSIVE METABOLIC PANEL
AG Ratio: 2.3 (calc) (ref 1.0–2.5)
ALT: 12 U/L (ref 6–29)
AST: 17 U/L (ref 10–35)
Albumin: 4.5 g/dL (ref 3.6–5.1)
Alkaline phosphatase (APISO): 45 U/L (ref 37–153)
BUN: 13 mg/dL (ref 7–25)
CO2: 30 mmol/L (ref 20–32)
Calcium: 9.8 mg/dL (ref 8.6–10.4)
Chloride: 105 mmol/L (ref 98–110)
Creat: 0.74 mg/dL (ref 0.50–1.03)
Globulin: 2 g/dL (calc) (ref 1.9–3.7)
Glucose, Bld: 90 mg/dL (ref 65–99)
Potassium: 4.8 mmol/L (ref 3.5–5.3)
Sodium: 141 mmol/L (ref 135–146)
Total Bilirubin: 0.6 mg/dL (ref 0.2–1.2)
Total Protein: 6.5 g/dL (ref 6.1–8.1)

## 2021-07-23 LAB — CBC WITH DIFFERENTIAL/PLATELET
Absolute Monocytes: 442 cells/uL (ref 200–950)
Basophils Absolute: 61 cells/uL (ref 0–200)
Basophils Relative: 1.3 %
Eosinophils Absolute: 244 cells/uL (ref 15–500)
Eosinophils Relative: 5.2 %
HCT: 43 % (ref 35.0–45.0)
Hemoglobin: 14.2 g/dL (ref 11.7–15.5)
Lymphs Abs: 1866 cells/uL (ref 850–3900)
MCH: 29 pg (ref 27.0–33.0)
MCHC: 33 g/dL (ref 32.0–36.0)
MCV: 87.8 fL (ref 80.0–100.0)
MPV: 10.4 fL (ref 7.5–12.5)
Monocytes Relative: 9.4 %
Neutro Abs: 2087 cells/uL (ref 1500–7800)
Neutrophils Relative %: 44.4 %
Platelets: 287 10*3/uL (ref 140–400)
RBC: 4.9 10*6/uL (ref 3.80–5.10)
RDW: 13 % (ref 11.0–15.0)
Total Lymphocyte: 39.7 %
WBC: 4.7 10*3/uL (ref 3.8–10.8)

## 2021-07-23 LAB — LIPID PANEL
Cholesterol: 226 mg/dL — ABNORMAL HIGH (ref <200)
HDL: 74 mg/dL (ref >=50)
LDL Cholesterol (Calc): 135 mg/dL (calc) — ABNORMAL HIGH
Non-HDL Cholesterol (Calc): 152 mg/dL (calc) — ABNORMAL HIGH (ref <130)
Total CHOL/HDL Ratio: 3.1 (calc) (ref <5.0)
Triglycerides: 77 mg/dL (ref <150)

## 2021-07-23 LAB — CYTOLOGY - PAP
Comment: NEGATIVE
Diagnosis: NEGATIVE
High risk HPV: NEGATIVE

## 2021-07-23 LAB — TSH: TSH: 1.27 mIU/L

## 2021-07-23 LAB — VITAMIN D 25 HYDROXY (VIT D DEFICIENCY, FRACTURES): Vit D, 25-Hydroxy: 71 ng/mL (ref 30–100)

## 2021-09-03 ENCOUNTER — Ambulatory Visit: Payer: No Typology Code available for payment source | Admitting: Nurse Practitioner

## 2022-01-01 ENCOUNTER — Encounter: Payer: Self-pay | Admitting: Nurse Practitioner

## 2022-07-21 DIAGNOSIS — F4312 Post-traumatic stress disorder, chronic: Secondary | ICD-10-CM | POA: Diagnosis not present

## 2022-07-21 DIAGNOSIS — F419 Anxiety disorder, unspecified: Secondary | ICD-10-CM | POA: Diagnosis not present

## 2022-07-21 DIAGNOSIS — F331 Major depressive disorder, recurrent, moderate: Secondary | ICD-10-CM | POA: Diagnosis not present

## 2022-07-23 ENCOUNTER — Ambulatory Visit: Payer: No Typology Code available for payment source | Admitting: Nurse Practitioner

## 2022-07-28 NOTE — Progress Notes (Unsigned)
Cardiology Office Note:    Date:  07/29/2022   ID:  [place], DOB [birth year], MRN [id number]  PCP:  Leonard Downing, [place] Providers Cardiologist:  Donato Heinz, MD Cardiology APP:  Ledora Bottcher, [place] { Referring MD: Leonard Downing, *   Chief Complaint  Patient presents with   Follow-up    Palpitations    History of Present Illness:    Renee Ochoa is a 56 y.o. female who was referred back to cardiology for possible mitral valve prolapse.  She was doing well until COVID-vaccine early 2021 resulting in heart racing.  Echocardiogram 03/11/2020 showed an LVEF 09-32%, grade 1 diastolic dysfunction, normal RV function, trivial mitral regurgitation.  Zio patch x7 days showed no significant arrhythmias, triggered events corresponded to sinus rhythm.  She was last seen by Dr. Gardiner Rhyme in clinic 05/15/2020 and felt her palpitations were decreasing in frequency.  She describes chest pain that sounded noncardiac and was not related to exertion.  Ischemic evaluation was not planned at that time.  She presents today for follow-up. She remains active and hikes regularly. She is getting at least 150 min of exercise per week. She is working on [employer], [employer]. No cardiac complaints. She is now on SSRI and feels better. She is concerned about family history of CAD - father had first MI at age 73. Discussed cholesterol goals.    Past Medical History:  Diagnosis Date   ADD (attention deficit disorder)    Arthritis    Colon polyp    Menstrual migraine    PONV (postoperative nausea and vomiting)     Past Surgical History:  Procedure Laterality Date   BREAST LUMPECTOMY     RIGHT   LAPAROSCOPIC APPENDECTOMY N/A 10/23/2013   Procedure: APPENDECTOMY LAPAROSCOPIC;  Surgeon: Haywood Lasso, MD;  Location: [facility];  Service: General;  Laterality: N/A;   RESECTOSCOPIC POLYPECTOMY, ENDOMYORESECTION, ENDOMETRIAL ABLATION  04/7123    UMBILICAL HERNIA REPAIR      Current Medications: Current Meds  Medication Sig   COLLAGEN PO Take by mouth.   escitalopram (LEXAPRO) 10 MG tablet Take 10 mg by mouth daily.   Loratadine (CLARITIN PO) Take by mouth.   Polyethylene Glycol 3350 (MIRALAX PO) Take by mouth.   VITAMIN D PO Take by mouth.     Allergies:   Fexofenadine hcl and Covid-19 mrna vacc (moderna)   Social History   Socioeconomic History   Marital status: Divorced    Spouse name: Not on file   Number of children: Not on file   Years of education: Not on file   Highest education level: Not on file  Occupational History   Not on file  Tobacco Use   Smoking status: Never   Smokeless tobacco: Never  Vaping Use   Vaping Use: Never used  Substance and Sexual Activity   Alcohol use: Yes    Alcohol/week: 2.0 standard drinks of alcohol    Types: 2 Standard drinks or equivalent per week   Drug use: No   Sexual activity: Yes    Comment: 1st intercourse 22 yo-5 partners-Vasectomy  Other Topics Concern   Not on file  Social History Narrative   Not on file   Social Determinants of Health   Financial Resource Strain: Not on file  Food Insecurity: Not on file  Transportation Needs: Not on file  Physical Activity: Not on file  Stress: Not on file  Social Connections: Not on file  Family History: The patient's family history includes Breast cancer (age of onset: 86) in her maternal grandmother; Dementia in her father and mother; Heart attack in her father; Hypertension in her father and paternal grandfather; Ulcerative colitis in her son.  ROS:   Please see the history of present illness.     All other systems reviewed and are negative.  EKGs/Labs/Other Studies Reviewed:    The following studies were reviewed today:  Heart monitor   EKG:  EKG is  ordered today.  The ekg ordered today demonstrates sinus HR 72  Recent Labs: No results found for requested labs within last 365 days.  Recent Lipid  Panel    Component Value Date/Time   CHOL 226 (H) 07/22/2021 1129   TRIG 77 07/22/2021 1129   HDL 74 07/22/2021 1129   CHOLHDL 3.1 07/22/2021 1129   VLDL 17 07/08/2017 1540   LDLCALC 135 (H) 07/22/2021 1129     Risk Assessment/Calculations:                Physical Exam:    VS:  BP 117/80   Pulse 72   Ht '5\' 1"'$  (1.549 m)   Wt 137 lb 3.2 oz (62.2 kg)   LMP 10/01/2016   SpO2 98%   BMI 25.92 kg/m     Wt Readings from Last 3 Encounters:  07/29/22 137 lb 3.2 oz (62.2 kg)  07/22/21 131 lb (59.4 kg)  05/15/21 131 lb (59.4 kg)     GEN:  Well nourished, well developed in no acute distress HEENT: Normal NECK: No JVD; No carotid bruits LYMPHATICS: No lymphadenopathy CARDIAC: RRR, soft murmur RESPIRATORY:  Clear to auscultation without rales, wheezing or rhonchi  ABDOMEN: Soft, non-tender, non-distended MUSCULOSKELETAL:  No edema; No deformity  SKIN: Warm and dry NEUROLOGIC:  Alert and oriented x 3 PSYCHIATRIC:  Normal affect   ASSESSMENT:    1. Palpitations   2. Atypical chest pain   3. Hyperlipidemia with target LDL less than 100   4. Heart murmur    PLAN:    In order of problems listed above:  Palpitations Following COVID vaccination Zio patch showed no significant arrhythmias and triggered events corresponded to sinus rhythm No further palpitations   Possible mitral valve prolapse Trivial MR on TTE in 2000 TTE 03/11/2020 shows normal mitral valve with trivial MR   Atypical chest pain No further chest pain   Hyperlipidemia with LDL goal < 100 Strong family history of premature CAD Father had first MI at age 107 LDL elevated in 2022 She is seeing a new PCP in the next 2 weeks - will defer labs to that provider, but discussed importance of lowering LDL, even in the setting of high HDL  Follow up in 2 years, sooner if needed.            Medication Adjustments/Labs and Tests Ordered: Current medicines are reviewed at length with the patient  today.  Concerns regarding medicines are outlined above.  Orders Placed This Encounter  Procedures   EKG 12-Lead   No orders of the defined types were placed in this encounter.   Patient Instructions  Medication Instructions:  No Changes *If you need a refill on your cardiac medications before your next appointment, please call your pharmacy*   Lab Work: No Labs If you have labs (blood work) drawn today and your tests are completely normal, you will receive your results only by: [place] (if you have MyChart) OR A paper copy in the mail  If you have any lab test that is abnormal or we need to change your treatment, we will call you to review the results.   Testing/Procedures: No Testing   Follow-Up: At [facility], you and your health needs are our priority.  As part of our continuing mission to provide you with exceptional heart care, we have created designated Provider Care Teams.  These Care Teams include your primary Cardiologist (physician) and Advanced Practice Providers (APPs -  Physician Assistants and Nurse Practitioners) who all work together to provide you with the care you need, when you need it.  We recommend signing up for the patient portal called "MyChart".  Sign up information is provided on this After Visit Summary.  MyChart is used to connect with patients for Virtual Visits (Telemedicine).  Patients are able to view lab/test results, encounter notes, upcoming appointments, etc.  Non-urgent messages can be sent to your provider as well.   To learn more about what you can do with MyChart, go to [email or web address].    Your next appointment:   2 year(s)  The format for your next appointment:   In Person  Provider:   Donato Heinz, MD     Signed, [place], [place]  07/29/2022 4:20 PM    [place]

## 2022-07-29 ENCOUNTER — Ambulatory Visit: Payer: BC Managed Care – PPO | Attending: Physician Assistant | Admitting: Physician Assistant

## 2022-07-29 ENCOUNTER — Encounter: Payer: Self-pay | Admitting: Physician Assistant

## 2022-07-29 VITALS — BP 117/80 | HR 72 | Ht 61.0 in | Wt 137.2 lb

## 2022-07-29 DIAGNOSIS — E785 Hyperlipidemia, unspecified: Secondary | ICD-10-CM | POA: Diagnosis not present

## 2022-07-29 DIAGNOSIS — R002 Palpitations: Secondary | ICD-10-CM

## 2022-07-29 DIAGNOSIS — R0789 Other chest pain: Secondary | ICD-10-CM

## 2022-07-29 DIAGNOSIS — R011 Cardiac murmur, unspecified: Secondary | ICD-10-CM

## 2022-07-29 NOTE — Patient Instructions (Signed)
Medication Instructions:  No Changes *If you need a refill on your cardiac medications before your next appointment, please call your pharmacy*   Lab Work: No Labs If you have labs (blood work) drawn today and your tests are completely normal, you will receive your results only by: [place] (if you have MyChart) OR A paper copy in the mail If you have any lab test that is abnormal or we need to change your treatment, we will call you to review the results.   Testing/Procedures: No Testing   Follow-Up: At [facility], you and your health needs are our priority.  As part of our continuing mission to provide you with exceptional heart care, we have created designated Provider Care Teams.  These Care Teams include your primary Cardiologist (physician) and Advanced Practice Providers (APPs -  Physician Assistants and Nurse Practitioners) who all work together to provide you with the care you need, when you need it.  We recommend signing up for the patient portal called "MyChart".  Sign up information is provided on this After Visit Summary.  MyChart is used to connect with patients for Virtual Visits (Telemedicine).  Patients are able to view lab/test results, encounter notes, upcoming appointments, etc.  Non-urgent messages can be sent to your provider as well.   To learn more about what you can do with MyChart, go to [email or web address].    Your next appointment:   2 year(s)  The format for your next appointment:   In Person  Provider:   Donato Heinz, MD

## 2022-08-14 ENCOUNTER — Encounter (HOSPITAL_BASED_OUTPATIENT_CLINIC_OR_DEPARTMENT_OTHER): Payer: Self-pay | Admitting: Nurse Practitioner

## 2022-08-14 ENCOUNTER — Ambulatory Visit (INDEPENDENT_AMBULATORY_CARE_PROVIDER_SITE_OTHER): Payer: BC Managed Care – PPO | Admitting: Nurse Practitioner

## 2022-08-14 VITALS — BP 97/54 | HR 84 | Ht 62.0 in | Wt 137.0 lb

## 2022-08-14 DIAGNOSIS — Z86010 Personal history of colonic polyps: Secondary | ICD-10-CM

## 2022-08-14 DIAGNOSIS — E559 Vitamin D deficiency, unspecified: Secondary | ICD-10-CM

## 2022-08-14 DIAGNOSIS — Z8632 Personal history of gestational diabetes: Secondary | ICD-10-CM

## 2022-08-14 DIAGNOSIS — E782 Mixed hyperlipidemia: Secondary | ICD-10-CM | POA: Diagnosis not present

## 2022-08-14 DIAGNOSIS — Z0000 Encounter for general adult medical examination without abnormal findings: Secondary | ICD-10-CM | POA: Diagnosis not present

## 2022-08-14 DIAGNOSIS — Z23 Encounter for immunization: Secondary | ICD-10-CM | POA: Diagnosis not present

## 2022-08-14 NOTE — Patient Instructions (Addendum)
Thank you for choosing [facility] for your Primary Care needs. I am excited for the opportunity to partner with you to meet your health care goals. It was a pleasure meeting you today!  Recommendations from today's visit: If you decide that you would like to try the hydroxyzine let me know.  We will get labs today and make sure everything is looking ok.  I will be happy to take over the lexapro for you. Please let me know when you need refills.   Information on diet, exercise, and health maintenance recommendations are listed below. This is information to help you be sure you are on track for optimal health and monitoring.   Please look over this and let us know if you have any questions or if you have completed any of the health maintenance outside of [place] so that we can be sure your records are up to date.  ___________________________________________________________ About Me: I am an Adult-Geriatric Nurse Practitioner with a background in caring for patients for more than 20 years with a strong intensive care background. I provide primary care and sports medicine services to patients age 92 and older within this office. My education had a strong focus on caring for the older adult population, which I am passionate about. I am also the director of the APP Fellowship with [facility].   My desire is to provide you with the best service through preventive medicine and supportive care. I consider you a part of the medical team and value your input. I work diligently to ensure that you are heard and your needs are met in a safe and effective manner. I want you to feel comfortable with me as your provider and want you to know that your health concerns are important to me.  For your information, our office hours are: Monday, Tuesday, and Thursday 8:00 AM - 5:00 PM Wednesday and Friday 8:00 AM - 12:00 PM.   In my time away from the office I am teaching new  APP's within the system and am unavailable, but my partner, Dr. Burnard Bunting is in the office for emergent needs.   If you have questions or concerns, please call our office at [phone] or send [place] a MyChart message and we will respond as quickly as possible.  ____________________________________________________________ MyChart:  For all urgent or time sensitive needs we ask that you please call the office to avoid delays. Our number is [phone]. MyChart is not constantly monitored and due to the large volume of messages a day, replies may take up to 72 business hours.  MyChart Policy: MyChart allows for you to see your visit notes, after visit summary, provider recommendations, lab and tests results, make an appointment, request refills, and contact your provider or the office for non-urgent questions or concerns. Providers are seeing patients during normal business hours and do not have built in time to review MyChart messages.  We ask that you allow a minimum of 3 business days for responses to [employer]. For this reason, please do not send urgent requests through [place]. Please call the office at [phone]. New and ongoing conditions may require a visit. We have virtual and in person visit available for your convenience.  Complex MyChart concerns may require a visit. Your provider may request you schedule a virtual or in person visit to ensure we are providing the best care possible. MyChart messages sent after 11:00 AM on Friday will not be received by the  provider until Monday morning.    Lab and Test Results: You will receive your lab and test results on MyChart as soon as they are completed and results have been sent by the lab or testing facility. Due to this service, you will receive your results BEFORE your provider.  I review lab and tests results each morning prior to seeing patients. Some results require collaboration with other providers to ensure you are receiving the  most appropriate care. For this reason, we ask that you please allow a minimum of 3-5 business days from the time the ALL results have been received for your provider to receive and review lab and test results and contact you about these.  Most lab and test result comments from the provider will be sent through [place]. Your provider may recommend changes to the plan of care, follow-up visits, repeat testing, ask questions, or request an office visit to discuss these results. You may reply directly to this message or call the office at [phone] to provide information for the provider or set up an appointment. In some instances, you will be called with test results and recommendations. Please let us know if this is preferred and we will make note of this in your chart to provide this for you.    If you have not heard a response to your lab or test results in 5 business days from all results returning to [place], please call the office to let us know. We ask that you please avoid calling prior to this time unless there is an emergent concern. Due to high call volumes, this can delay the resulting process.  After Hours: For all non-emergency after hours needs, please call the office at [phone] and select the option to reach the on-call provider service. On-call services are shared between multiple [place] offices and therefore it will not be possible to speak directly with your provider. On-call providers may provide medical advice and recommendations, but are unable to provide refills for maintenance medications.  For all emergency or urgent medical needs after normal business hours, we recommend that you seek care at the closest Urgent Care or Emergency Department to ensure appropriate treatment in a timely manner.  [facility] [place] has a 24 hour emergency room located on the ground floor for your convenience.   Urgent Concerns During the Business Day Providers are seeing  patients from 8AM to [place] with a busy schedule and are most often not able to respond to non-urgent calls until the end of the day or the next business day. If you should have URGENT concerns during the day, please call and speak to the nurse or schedule a same day appointment so that we can address your concern without delay.   Thank you, again, for choosing me as your health care partner. I appreciate your trust and look forward to learning more about you.   Worthy Keeler, DNP, AGNP-c ___________________________________________________________  Health Maintenance Recommendations Screening Testing Mammogram Every 1 -2 years based on history and risk factors Starting at age 80 Pap Smear Ages 21-39 every 3 years Ages 33-65 every 5 years with HPV testing More frequent testing may be required based on results and history Colon Cancer Screening Every 1-10 years based on test performed, risk factors, and history Starting at age 45 Bone Density Screening Every 2-10 years based on history Starting at age 74 for women Recommendations for men differ based on medication usage, history, and risk factors AAA Screening One time  ultrasound Men 15-14 years old who have every smoked Lung Cancer Screening Low Dose Lung CT every 12 months Age 61-80 years with a 30 pack-year smoking history who still smoke or who have quit within the last 15 years  Screening Labs Routine  Labs: Complete Blood Count (CBC), Complete Metabolic Panel (CMP), Cholesterol (Lipid Panel) Every 6-12 months based on history and medications May be recommended more frequently based on current conditions or previous results Hemoglobin A1c Lab Every 3-12 months based on history and previous results Starting at age 13 or earlier with diagnosis of diabetes, high cholesterol, BMI >26, and/or risk factors Frequent monitoring for patients with diabetes to ensure blood sugar control Thyroid Panel (TSH w/ T3 & T4) Every 6 months  based on history, symptoms, and risk factors May be repeated more often if on medication HIV One time testing for all patients 52 and older May be repeated more frequently for patients with increased risk factors or exposure Hepatitis C One time testing for all patients 34 and older May be repeated more frequently for patients with increased risk factors or exposure Gonorrhea, Chlamydia Every 12 months for all sexually active persons 13-24 years Additional monitoring may be recommended for those who are considered high risk or who have symptoms PSA Men 72-38 years old with risk factors Additional screening may be recommended from age 43-69 based on risk factors, symptoms, and history  Vaccine Recommendations Tetanus Booster All adults every 10 years Flu Vaccine All patients 6 months and older every year COVID Vaccine All patients 12 years and older Initial dosing with booster May recommend additional booster based on age and health history HPV Vaccine 2 doses all patients age 75-26 Dosing may be considered for patients over 26 Shingles Vaccine (Shingrix) 2 doses all adults 55 years and older Pneumonia (Pneumovax 23) All adults 37 years and older May recommend earlier dosing based on health history Pneumonia (Prevnar 48) All adults 60 years and older Dosed 1 year after Pneumovax 23  Additional Screening, Testing, and Vaccinations may be recommended on an individualized basis based on family history, health history, risk factors, and/or exposure.  __________________________________________________________  Diet Recommendations for All Patients  I recommend that all patients maintain a diet low in saturated fats, carbohydrates, and cholesterol. While this can be challenging at first, it is not impossible and small changes can make big differences.  Things to try: Decreasing the amount of soda, sweet tea, and/or juice to one or less per day and replace with water While water is  always the first choice, if you do not like water you may consider adding a water additive without sugar to improve the taste other sugar free drinks Replace potatoes with a brightly colored vegetable at dinner Use healthy oils, such as canola oil or olive oil, instead of butter or hard margarine Limit your bread intake to two pieces or less a day Replace regular pasta with low carb pasta options Bake, broil, or grill foods instead of frying Monitor portion sizes  Eat smaller, more frequent meals throughout the day instead of large meals  An important thing to remember is, if you love foods that are not great for your health, you don't have to give them up completely. Instead, allow these foods to be a reward when you have done well. Allowing yourself to still have special treats every once in a while is a nice way to tell yourself thank you for working hard to keep yourself healthy.   Also remember  that every day is a new day. If you have a bad day and "fall off the wagon", you can still climb right back up and keep moving along on your journey!  We have resources available to help you!  Some websites that may be helpful include: www.http://carter.biz/  Www.VeryWellFit.com _____________________________________________________________  Activity Recommendations for All Patients  I recommend that all adults get at least 20 minutes of moderate physical activity that elevates your heart rate at least 5 days out of the week.  Some examples include: Walking or jogging at a pace that allows you to carry on a conversation Cycling (stationary bike or outdoors) Water aerobics Yoga Weight lifting Dancing If physical limitations prevent you from putting stress on your joints, exercise in a pool or seated in a chair are excellent options.  Do determine your MAXIMUM heart rate for activity: YOUR AGE - 220 = MAX HeartRate   Remember! Do not push yourself too hard.  Start slowly and build up your pace,  speed, weight, time in exercise, etc.  Allow your body to rest between exercise and get good sleep. You will need more water than normal when you are exerting yourself. Do not wait until you are thirsty to drink. Drink with a purpose of getting in at least 8, 8 ounce glasses of water a day plus more depending on how much you exercise and sweat.    If you begin to develop dizziness, chest pain, abdominal pain, jaw pain, shortness of breath, headache, vision changes, lightheadedness, or other concerning symptoms, stop the activity and allow your body to rest. If your symptoms are severe, seek emergency evaluation immediately. If your symptoms are concerning, but not severe, please let us know so that we can recommend further evaluation.

## 2022-08-14 NOTE — Progress Notes (Signed)
Orma Render, DNP, AGNP-c [facility] [address]  [place], [place] [address] [phone]  New patient visit   Patient: Renee Ochoa   DOB: 01/15/1966   56 y.o. Female  MRN: [id number] Visit Date: 08/14/2022  Patient Care Team: Leonard Downing, MD as PCP - General (Family Medicine) Donato Heinz, MD as PCP - Cardiology (Cardiology) Rob Hickman Tami Lin, Boyd as Physician Assistant (Cardiology)  Today's Vitals   08/14/22 0902  BP: (!) 97/54  Pulse: 84  SpO2: 97%  Weight: 137 lb (62.1 kg)  Height: '5\' 2"'  (1.575 m)   Body mass index is 25.06 kg/m.   Today's healthcare provider: Orma Render, NP   Chief Complaint  Patient presents with   New Patient (Initial Visit)    Patient presents today to establish care. Patient would like flu shot while in office.    Subjective    Renee Ochoa is a 56 y.o. female who presents today as a new patient to establish care.    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 She was in a study with [employer] about exercise and memory.   She exercises well over 150 min a week with hiking and walking She tells me she has had some issues with sleep in the  past, but being on Lexapro has helped reduce this 50%  She is in need of a colonoscopy in the near future. She tells me they did find polyps and recommended that they repeat this. She was with [facility] in the past, but has had some personal concerns with the practice and would like to transition.  History reviewed and reveals the following: Past Medical History:  Diagnosis Date   ADD (attention deficit disorder)    Allergy 1992   Anxiety    Arthritis    Cancer ([place])    Colon polyp    Depression    Heart murmur    Menstrual migraine    Moderate mixed hyperlipidemia not requiring statin therapy 08/22/2022   PONV (postoperative nausea and vomiting)    Past Surgical History:  Procedure Laterality Date   APPENDECTOMY  2014   BREAST LUMPECTOMY     RIGHT   HERNIA REPAIR  1974   LAPAROSCOPIC APPENDECTOMY N/A 10/23/2013   Procedure: APPENDECTOMY LAPAROSCOPIC;  Surgeon: Haywood Lasso, MD;  Location: [facility];  Service: General;  Laterality: N/A;   RESECTOSCOPIC POLYPECTOMY, ENDOMYORESECTION, ENDOMETRIAL ABLATION  71/21/9758   UMBILICAL HERNIA REPAIR     Family Status  Relation Name Status   Father Lovey Newcomer (Not Specified)   Buckman (Not Specified)   PGF [place] (Not Specified)   Mother Legrand Como (Not Specified)   Son Erlene Quan (Not Specified)  MGF Mikki Santee (Not Specified)   Brother Pharmacologist (Not Specified)   Family History  Problem Relation Age of Onset   Hypertension Father    Heart attack Father    Dementia Father    Alcohol abuse Father    Heart disease Father    Breast cancer Maternal Grandmother 80   Cancer Maternal Grandmother    Hypertension Paternal Grandfather    Heart disease Paternal Grandfather    Stroke Paternal Grandfather    Dementia Mother    Alcohol abuse Mother    Varicose Veins Mother    Ulcerative colitis Son    Asthma Son    Alcohol abuse Maternal Grandfather    Asthma Brother    Social History   Socioeconomic History   Marital status: Divorced    Spouse name:  Not on file   Number of children: Not on file   Years of education: Not on file   Highest education level: Not on file  Occupational History   Not on file  Tobacco Use   Smoking status: Never   Smokeless tobacco: Never  Vaping Use   Vaping Use: Never used  Substance and Sexual Activity   Alcohol use: Yes    Alcohol/week: 2.0 standard drinks of alcohol    Types: 2 Standard drinks or equivalent per week   Drug use: No   Sexual activity: Yes    Comment: 1st intercourse 22 yo-5 partners-Vasectomy  Other Topics Concern   Not on file  Social History Narrative   Not on file   Social Determinants of Health   Financial Resource Strain: Not on file  Food Insecurity: Not on file  Transportation Needs: Not on file  Physical Activity: Not on file  Stress: Not on file  Social Connections: Not on file   Outpatient Medications Prior to Visit  Medication Sig   COLLAGEN PO Take by mouth.   escitalopram (LEXAPRO) 10 MG tablet Take 10 mg by mouth daily.   Loratadine (CLARITIN PO) Take by mouth.   Polyethylene Glycol 3350 (MIRALAX PO) Take by mouth.   VITAMIN D PO Take by mouth.   No facility-administered medications prior to visit.   Allergies  Allergen Reactions   Fexofenadine Hcl Hives   Covid-19 Mrna Vacc (Moderna) Palpitations and Swelling   Immunization History  Administered Date(s) Administered   Influenza,inj,Quad PF,6+ Mos 08/14/2022   Influenza-Unspecified 12/18/2017, 09/22/2021    Review of Systems All review of systems negative except what is listed in the HPI   Objective    BP (!) 97/54   Pulse 84   Ht '5\' 2"'  (1.575 m)   Wt 137 lb (62.1 kg)   LMP 10/01/2016   SpO2 97%   BMI 25.06 kg/m  Physical Exam Vitals and nursing note reviewed.  Constitutional:      General: She is not in acute distress.    Appearance: Normal appearance.  HENT:     Head: Normocephalic and atraumatic.     Right Ear: Hearing, tympanic membrane, ear canal and external ear normal.      Left Ear: Hearing, tympanic membrane, ear canal and external ear normal.     Nose: Nose normal.     Right Sinus: No maxillary sinus tenderness or frontal sinus tenderness.     Left Sinus: No maxillary sinus tenderness or frontal sinus tenderness.     Mouth/Throat:     Lips: Pink.     Mouth: Mucous membranes are moist.     Pharynx: Oropharynx is clear.  Eyes:     General: Lids are normal. Vision grossly intact.     Extraocular Movements: Extraocular movements intact.     Conjunctiva/sclera: Conjunctivae normal.     Pupils: Pupils are equal, round, and reactive to light.     Funduscopic exam:    Right eye: Red reflex present.        Left eye: Red reflex present.    Visual Fields: Right eye visual fields normal and left eye visual fields normal.  Neck:     Thyroid: No thyromegaly.     Vascular: No carotid bruit.  Cardiovascular:     Rate and Rhythm: Normal rate and regular rhythm.     Chest Wall: PMI is not displaced.     Pulses: Normal pulses.          Dorsalis pedis pulses are 2+ on the right side and 2+ on the left side.       Posterior tibial pulses are 2+ on the right side and 2+ on the left side.     Heart sounds: Normal heart sounds. No murmur heard. Pulmonary:     Effort: Pulmonary effort is normal. No respiratory distress.     Breath sounds: Normal breath sounds.  Abdominal:     General: Abdomen is flat. Bowel sounds are normal. There is no distension.     Palpations: Abdomen is soft. There is no hepatomegaly, splenomegaly or mass.     Tenderness: There is no abdominal tenderness. There is no right CVA tenderness, left CVA tenderness, guarding or rebound.  Musculoskeletal:        General: Normal range of motion.     Cervical back: Full passive range of motion without pain, normal range of motion and neck supple. No tenderness.     Right lower leg: No edema.     Left lower leg: No edema.  Feet:     Left foot:     Toenail Condition: Left toenails are normal.   Lymphadenopathy:     Cervical: No cervical adenopathy.     Upper Body:     Right upper body: No supraclavicular adenopathy.     Left upper body: No supraclavicular adenopathy.  Skin:    General: Skin is warm and dry.     Capillary Refill: Capillary refill takes less than 2 seconds.     Nails: There is no clubbing.  Neurological:     General: No focal deficit present.     Mental Status: She is alert and oriented to person, place, and time.     GCS: GCS eye subscore is 4. GCS verbal subscore is 5. GCS motor subscore is 6.     Sensory: Sensation is intact.     Motor: Motor function is intact.     Coordination: Coordination is intact.     Gait: Gait is intact.     Deep Tendon Reflexes: Reflexes are normal and symmetric.  Psychiatric:        Attention and Perception: Attention normal.        Mood and Affect: Mood normal.        Speech: Speech normal.        Behavior: Behavior normal. Behavior is cooperative.        Thought Content: Thought content normal.        Cognition and Memory: Cognition and memory normal.        Judgment: Judgment normal.     Results for orders placed or performed in visit on 08/14/22  CBC  With Diff/Platelet  Result Value Ref Range   WBC 5.4 3.4 - 10.8 x10E3/uL   RBC 5.14 3.77 - 5.28 x10E6/uL   Hemoglobin 14.4 11.1 - 15.9 g/dL   Hematocrit 43.1 34.0 - 46.6 %   MCV 84 79 - 97 fL   MCH 28.0 26.6 - 33.0 pg   MCHC 33.4 31.5 - 35.7 g/dL   RDW 12.0 11.7 - 15.4 %   Platelets 299 150 - 450 x10E3/uL   Neutrophils 45 Not Estab. %   Lymphs 37 Not Estab. %   Monocytes 10 Not Estab. %   Eos 6 Not Estab. %   Basos 2 Not Estab. %   Neutrophils Absolute 2.4 1.4 - 7.0 x10E3/uL   Lymphocytes Absolute 2.0 0.7 - 3.1 x10E3/uL   Monocytes Absolute 0.6 0.1 - 0.9 x10E3/uL   EOS (ABSOLUTE) 0.3 0.0 - 0.4 x10E3/uL   Basophils Absolute 0.1 0.0 - 0.2 x10E3/uL   Immature Granulocytes 0 Not Estab. %   Immature Grans (Abs) 0.0 0.0 - 0.1 x10E3/uL  Comprehensive metabolic  panel  Result Value Ref Range   Glucose 90 70 - 99 mg/dL   BUN 11 6 - 24 mg/dL   Creatinine, Ser 0.83 0.57 - 1.00 mg/dL   eGFR 84 >59 mL/min/1.73   BUN/Creatinine Ratio 13 9 - 23   Sodium 140 134 - 144 mmol/L   Potassium 4.5 3.5 - 5.2 mmol/L   Chloride 102 96 - 106 mmol/L   CO2 25 20 - 29 mmol/L   Calcium 9.8 8.7 - 10.2 mg/dL   Total Protein 6.4 6.0 - 8.5 g/dL   Albumin 4.5 3.8 - 4.9 g/dL   Globulin, Total 1.9 1.5 - 4.5 g/dL   Albumin/Globulin Ratio 2.4 (H) 1.2 - 2.2   Bilirubin Total 0.5 0.0 - 1.2 mg/dL   Alkaline Phosphatase 58 44 - 121 IU/L   AST 15 0 - 40 IU/L   ALT 11 0 - 32 IU/L  Hemoglobin A1c  Result Value Ref Range   Hgb A1c MFr Bld 5.5 4.8 - 5.6 %   Est. average glucose Bld gHb Est-mCnc 111 mg/dL  VITAMIN D 25 Hydroxy (Vit-D Deficiency, Fractures)  Result Value Ref Range   Vit D, 25-Hydroxy 89.6 30.0 - 100.0 ng/mL  Lipid panel  Result Value Ref Range   Cholesterol, Total 226 (H) 100 - 199 mg/dL   Triglycerides 82 0 - 149 mg/dL   HDL 70 >39 mg/dL   VLDL Cholesterol Cal 14 5 - 40 mg/dL   LDL Chol Calc (NIH) 142 (H) 0 - 99 mg/dL   Chol/HDL Ratio 3.2 0.0 - 4.4 ratio  TSH  Result Value Ref Range   TSH 1.130 0.450 - 4.500 uIU/mL  T4, free  Result Value Ref Range   Free T4 1.40 0.82 - 1.77 ng/dL    Assessment & Plan      Problem List Items Addressed This Visit     History of gestational diabetes    No recent diagnosis or evidence of poor glycemic control at this time.  We will obtain labs today for evaluation and make changes to plan of care as necessary based on findings.      Relevant Orders   Hemoglobin A1c (Completed)   Moderate mixed hyperlipidemia not requiring statin therapy    Historically diet managed.  No medications at this time.  We will repeat labs today for evaluation and make changes to plan of care as necessary based on findings.  Relevant Orders   Lipid panel (Completed)   Encounter for medical examination to establish care - Primary     Patient to establish care today.  Flu shot given. CPE today with no abnormalities noted on exam.  Labs pending. Will make changes as necessary based on results.  Review of HM activities and recommendations discussed and provided on AVS Anticipatory guidance, diet, and exercise recommendations provided.  Medications, allergies, and hx reviewed and updated as necessary.  Plan to f/u with CPE in 1 year or sooner for acute/chronic health needs as directed.        Other Visit Diagnoses     Vitamin D deficiency       Relevant Orders   VITAMIN D 25 Hydroxy (Vit-D Deficiency, Fractures) (Completed)   History of colon polyps       Relevant Orders   Ambulatory referral to Gastroenterology   Healthcare maintenance       Relevant Orders   CBC With Diff/Platelet (Completed)   Comprehensive metabolic panel (Completed)   Hemoglobin A1c (Completed)   VITAMIN D 25 Hydroxy (Vit-D Deficiency, Fractures) (Completed)   Lipid panel (Completed)   Ambulatory referral to Gastroenterology   TSH (Completed)   T4, free (Completed)   Flu vaccine need       Relevant Orders   Flu Vaccine QUAD 6+ mos PF IM (Fluarix Quad PF) (Completed)        Return in about 1 year (around 08/15/2023) for CPE today- CPE in 1 year.      Essie Gehret, Coralee Pesa, NP, DNP, AGNP-C [facility] Maintenance Due Health Maintenance Topics with due status: Overdue     Topic Date Due   COVID-19 Vaccine Never done   HIV Screening Never done   Hepatitis C Screening Never done   TETANUS/TDAP Never done   Zoster Vaccines- Shingrix Never done   COLONOSCOPY (Pts 45-97yr Insurance coverage will need to be confirmed) Never done

## 2022-08-15 LAB — COMPREHENSIVE METABOLIC PANEL
ALT: 11 IU/L (ref 0–32)
AST: 15 IU/L (ref 0–40)
Albumin/Globulin Ratio: 2.4 — ABNORMAL HIGH (ref 1.2–2.2)
Albumin: 4.5 g/dL (ref 3.8–4.9)
Alkaline Phosphatase: 58 IU/L (ref 44–121)
BUN/Creatinine Ratio: 13 (ref 9–23)
BUN: 11 mg/dL (ref 6–24)
Bilirubin Total: 0.5 mg/dL (ref 0.0–1.2)
CO2: 25 mmol/L (ref 20–29)
Calcium: 9.8 mg/dL (ref 8.7–10.2)
Chloride: 102 mmol/L (ref 96–106)
Creatinine, Ser: 0.83 mg/dL (ref 0.57–1.00)
Globulin, Total: 1.9 g/dL (ref 1.5–4.5)
Glucose: 90 mg/dL (ref 70–99)
Potassium: 4.5 mmol/L (ref 3.5–5.2)
Sodium: 140 mmol/L (ref 134–144)
Total Protein: 6.4 g/dL (ref 6.0–8.5)
eGFR: 84 mL/min/{1.73_m2} (ref >59)

## 2022-08-15 LAB — CBC WITH DIFF/PLATELET
Basophils Absolute: 0.1 10*3/uL (ref 0.0–0.2)
Basos: 2 %
EOS (ABSOLUTE): 0.3 10*3/uL (ref 0.0–0.4)
Eos: 6 %
Hematocrit: 43.1 % (ref 34.0–46.6)
Hemoglobin: 14.4 g/dL (ref 11.1–15.9)
Immature Grans (Abs): 0 10*3/uL (ref 0.0–0.1)
Immature Granulocytes: 0 %
Lymphocytes Absolute: 2 10*3/uL (ref 0.7–3.1)
Lymphs: 37 %
MCH: 28 pg (ref 26.6–33.0)
MCHC: 33.4 g/dL (ref 31.5–35.7)
MCV: 84 fL (ref 79–97)
Monocytes Absolute: 0.6 10*3/uL (ref 0.1–0.9)
Monocytes: 10 %
Neutrophils Absolute: 2.4 10*3/uL (ref 1.4–7.0)
Neutrophils: 45 %
Platelets: 299 10*3/uL (ref 150–450)
RBC: 5.14 x10E6/uL (ref 3.77–5.28)
RDW: 12 % (ref 11.7–15.4)
WBC: 5.4 10*3/uL (ref 3.4–10.8)

## 2022-08-15 LAB — TSH: TSH: 1.13 u[IU]/mL (ref 0.450–4.500)

## 2022-08-15 LAB — VITAMIN D 25 HYDROXY (VIT D DEFICIENCY, FRACTURES): Vit D, 25-Hydroxy: 89.6 ng/mL (ref 30.0–100.0)

## 2022-08-15 LAB — LIPID PANEL
Chol/HDL Ratio: 3.2 ratio (ref 0.0–4.4)
Cholesterol, Total: 226 mg/dL — ABNORMAL HIGH (ref 100–199)
HDL: 70 mg/dL (ref >39)
LDL Chol Calc (NIH): 142 mg/dL — ABNORMAL HIGH (ref 0–99)
Triglycerides: 82 mg/dL (ref 0–149)
VLDL Cholesterol Cal: 14 mg/dL (ref 5–40)

## 2022-08-15 LAB — HEMOGLOBIN A1C
Est. average glucose Bld gHb Est-mCnc: 111 mg/dL
Hgb A1c MFr Bld: 5.5 % (ref 4.8–5.6)

## 2022-08-15 LAB — T4, FREE: Free T4: 1.4 ng/dL (ref 0.82–1.77)

## 2022-08-22 ENCOUNTER — Encounter (HOSPITAL_BASED_OUTPATIENT_CLINIC_OR_DEPARTMENT_OTHER): Payer: Self-pay | Admitting: Nurse Practitioner

## 2022-08-22 DIAGNOSIS — Z0000 Encounter for general adult medical examination without abnormal findings: Secondary | ICD-10-CM | POA: Insufficient documentation

## 2022-08-22 DIAGNOSIS — E782 Mixed hyperlipidemia: Secondary | ICD-10-CM

## 2022-08-22 HISTORY — DX: Encounter for general adult medical examination without abnormal findings: Z00.00

## 2022-08-22 HISTORY — DX: Mixed hyperlipidemia: E78.2

## 2022-08-22 NOTE — Assessment & Plan Note (Signed)
No recent diagnosis or evidence of poor glycemic control at this time.  We will obtain labs today for evaluation and make changes to plan of care as necessary based on findings.

## 2022-08-22 NOTE — Assessment & Plan Note (Signed)
Historically diet managed.  No medications at this time.  We will repeat labs today for evaluation and make changes to plan of care as necessary based on findings.

## 2022-08-22 NOTE — Assessment & Plan Note (Signed)
Patient to establish care today.  Flu shot given. CPE today with no abnormalities noted on exam.  Labs pending. Will make changes as necessary based on results.  Review of HM activities and recommendations discussed and provided on AVS Anticipatory guidance, diet, and exercise recommendations provided.  Medications, allergies, and hx reviewed and updated as necessary.  Plan to f/u with CPE in 1 year or sooner for acute/chronic health needs as directed.

## 2023-01-05 DIAGNOSIS — Z1231 Encounter for screening mammogram for malignant neoplasm of breast: Secondary | ICD-10-CM | POA: Diagnosis not present

## 2023-01-06 ENCOUNTER — Encounter: Payer: Self-pay | Admitting: Nurse Practitioner

## 2023-01-11 DIAGNOSIS — R922 Inconclusive mammogram: Secondary | ICD-10-CM | POA: Diagnosis not present

## 2023-01-11 DIAGNOSIS — Z85828 Personal history of other malignant neoplasm of skin: Secondary | ICD-10-CM | POA: Diagnosis not present

## 2023-01-11 DIAGNOSIS — N6489 Other specified disorders of breast: Secondary | ICD-10-CM | POA: Diagnosis not present

## 2023-01-11 DIAGNOSIS — L814 Other melanin hyperpigmentation: Secondary | ICD-10-CM | POA: Diagnosis not present

## 2023-01-11 DIAGNOSIS — L821 Other seborrheic keratosis: Secondary | ICD-10-CM | POA: Diagnosis not present

## 2023-01-11 DIAGNOSIS — D2362 Other benign neoplasm of skin of left upper limb, including shoulder: Secondary | ICD-10-CM | POA: Diagnosis not present

## 2023-01-12 ENCOUNTER — Ambulatory Visit (INDEPENDENT_AMBULATORY_CARE_PROVIDER_SITE_OTHER): Payer: BC Managed Care – PPO | Admitting: Nurse Practitioner

## 2023-01-12 ENCOUNTER — Encounter: Payer: Self-pay | Admitting: Nurse Practitioner

## 2023-01-12 VITALS — BP 122/76 | HR 95 | Wt 142.2 lb

## 2023-01-12 DIAGNOSIS — M545 Low back pain, unspecified: Secondary | ICD-10-CM

## 2023-01-12 DIAGNOSIS — F411 Generalized anxiety disorder: Secondary | ICD-10-CM | POA: Insufficient documentation

## 2023-01-12 HISTORY — DX: Low back pain, unspecified: M54.50

## 2023-01-12 LAB — POCT URINALYSIS DIP (CLINITEK)
Bilirubin, UA: NEGATIVE
Glucose, UA: NEGATIVE mg/dL
Ketones, POC UA: NEGATIVE mg/dL
Leukocytes, UA: NEGATIVE
Nitrite, UA: NEGATIVE
POC PROTEIN,UA: NEGATIVE
Spec Grav, UA: <=1.005 — AB (ref 1.010–1.025)
Urobilinogen, UA: 0.2 E.U./dL
pH, UA: 7 (ref 5.0–8.0)

## 2023-01-12 MED ORDER — LORAZEPAM 0.5 MG PO TABS: 0.5000 mg | ORAL_TABLET | Freq: Every evening | ORAL | 0 refills | Status: AC | PRN

## 2023-01-12 MED ORDER — TAMSULOSIN HCL 0.4 MG PO CAPS: 0.4000 mg | ORAL_CAPSULE | Freq: Every day | ORAL | 0 refills | Status: DC

## 2023-01-12 MED ORDER — ESCITALOPRAM OXALATE 10 MG PO TABS: 10.0000 mg | ORAL_TABLET | Freq: Every day | ORAL | 3 refills | Status: DC

## 2023-01-12 NOTE — Patient Instructions (Addendum)
It sounds like you may have a kidney stone. I have sent in some tamsulosin to try to see if this will help with the movement. If this gets worse, please let me know.

## 2023-01-12 NOTE — Assessment & Plan Note (Signed)
She has chosen to transition medication management from psychiatric nurse practitioner to primary care provider. Currently prescribed Lexapro 10 mg with one refill left. Ativan refills are not required at this time. No alarm symptoms or worsening of control at this time.  Plan: - Maintain Lexapro 10 mg daily. - Regularly assess anxiety symptoms and modify medication regimen as necessary during subsequent appointments.

## 2023-01-12 NOTE — Assessment & Plan Note (Signed)
She is experiencing intermittent lower back pain on the right side, occasionally extending towards the right flank and into the right lower quadrant, near site of a previous appendectomy. No urinary complaints or fever have been noted. Plan: - Collect urine specimen to test for microscopic hematuria. - If hematuria is detected, consider prescribing Tamsulosin to aid in stone passage. - Advise increased fluid intake. - If pain escalates or new symptoms emerge, contemplate diagnostic imaging (x-ray or CT) to investigate pain etiology. - Schedule follow-up based on patient's symptomatology and diagnostic findings.

## 2023-01-12 NOTE — Progress Notes (Signed)
Orma Render, DNP, AGNP-c [facility] [address] [place], [place] [address] [phone]  Subjective:   Renee Ochoa is a 57 y.o. female presents to day for evaluation of: Depression/Anxiety The patient expresses a desire to discontinue their current psychiatric care with a nurse practitioner and has requested that I take over the prescription management of their Lexapro. They are currently prescribed 10 mg of Lexapro with one refill remaining. Additionally, the patient has been prescribed Ativan (0.5 mg) for anxiety and occasional sleep issues; however, they report not having refilled this prescription in a while and do not feel the need for a refill at this time. She reports her symptoms are well controlled with no alarm symptoms present.   Low Back and Right LQ Pain Regarding their lower back pain, the patient rates it as a 1 or 2 on the pain scale and describes the pain as sometimes feeling localized to the area where their appendix used to be. She has a history of an appendectomy and mentions the presence of scar tissue in that area. The patient is uncertain if the pain is associated with recent weight gain or increased pressure in the region. She denies any urinary symptoms, but notes that the pain appears to come in waves. The patient reports no symptoms of fever or chills.   PMH, Medications, and Allergies reviewed and updated in chart as appropriate.   ROS negative except for what is listed in HPI. Objective:  BP 122/76   Pulse 95   Wt 142 lb 3.2 oz (64.5 kg)   LMP 10/01/2016   BMI 26.01 kg/m  Physical Exam Vitals and nursing note reviewed.  Constitutional:      Appearance: Normal appearance.  HENT:     Head: Normocephalic.  Eyes:     Pupils: Pupils are equal, round, and reactive to light.  Cardiovascular:     Rate and Rhythm: Normal rate and regular rhythm.     Pulses: Normal pulses.     Heart sounds: Normal heart sounds.  Pulmonary:      Effort: Pulmonary effort is normal.     Breath sounds: Normal breath sounds.  Abdominal:     General: Abdomen is flat. Bowel sounds are normal. There is no distension.     Palpations: Abdomen is soft. There is no mass.     Tenderness: There is abdominal tenderness. There is right CVA tenderness. There is no left CVA tenderness, guarding or rebound.  Skin:    General: Skin is warm and dry.     Capillary Refill: Capillary refill takes less than 2 seconds.  Neurological:     General: No focal deficit present.     Mental Status: She is alert.  Psychiatric:        Mood and Affect: Mood normal.        Behavior: Behavior normal.        Thought Content: Thought content normal.        Judgment: Judgment normal.           Assessment & Plan:   Problem List Items Addressed This Visit     Generalized anxiety disorder - Primary    She has chosen to transition medication management from psychiatric nurse practitioner to primary care provider. Currently prescribed Lexapro 10 mg with one refill left. Ativan refills are not required at this time. No alarm symptoms or worsening of control at this time.  Plan: - Maintain Lexapro 10 mg daily. - Regularly assess anxiety symptoms  and modify medication regimen as necessary during subsequent appointments.      Relevant Medications   LORazepam (ATIVAN) 0.5 MG tablet   escitalopram (LEXAPRO) 10 MG tablet   Acute right-sided low back pain without sciatica    She is experiencing intermittent lower back pain on the right side, occasionally extending towards the right flank and into the right lower quadrant, near site of a previous appendectomy. No urinary complaints or fever have been noted. Plan: - Collect urine specimen to test for microscopic hematuria. - If hematuria is detected, consider prescribing Tamsulosin to aid in stone passage. - Advise increased fluid intake. - If pain escalates or new symptoms emerge, contemplate diagnostic imaging (x-ray  or CT) to investigate pain etiology. - Schedule follow-up based on patient's symptomatology and diagnostic findings.      Relevant Medications   tamsulosin (FLOMAX) 0.4 MG CAPS capsule   Other Relevant Orders   POCT URINALYSIS DIP (CLINITEK)   UA normal with exception of small amount of whole blood present. Will send treatment with tamsulosin for suspected nephrolithiasis and monitor.    Orma Render, DNP, AGNP-c 01/12/2023  7:28 PM    History, Medications, Surgery, SDOH, and Family History reviewed and updated as appropriate.

## 2023-03-02 ENCOUNTER — Other Ambulatory Visit: Payer: Self-pay

## 2023-03-02 ENCOUNTER — Encounter: Payer: Self-pay | Admitting: Nurse Practitioner

## 2023-03-02 DIAGNOSIS — M545 Low back pain, unspecified: Secondary | ICD-10-CM

## 2023-03-02 MED ORDER — TAMSULOSIN HCL 0.4 MG PO CAPS: 0.4000 mg | ORAL_CAPSULE | Freq: Every day | ORAL | 1 refills | Status: DC

## 2023-08-26 ENCOUNTER — Telehealth: Payer: Self-pay | Admitting: Gastroenterology

## 2023-08-26 NOTE — Telephone Encounter (Signed)
Good Afternoon Dr Glee Arvin MD PM   We have received as referral for patient to be seen for a colonoscopy.   Patient has previous history with [facility] and is requesting transfer due to irreconcilable differences.   Records are available for review in epic. Please review and advise on scheduling.   Thank you

## 2023-09-23 ENCOUNTER — Encounter: Payer: Self-pay | Admitting: Nurse Practitioner

## 2023-09-23 ENCOUNTER — Ambulatory Visit: Payer: BC Managed Care – PPO | Admitting: Nurse Practitioner

## 2023-09-23 VITALS — BP 120/78 | HR 80 | Ht 62.0 in | Wt 140.8 lb

## 2023-09-23 DIAGNOSIS — Z0000 Encounter for general adult medical examination without abnormal findings: Secondary | ICD-10-CM

## 2023-09-23 DIAGNOSIS — Z8632 Personal history of gestational diabetes: Secondary | ICD-10-CM | POA: Diagnosis not present

## 2023-09-23 DIAGNOSIS — Z23 Encounter for immunization: Secondary | ICD-10-CM

## 2023-09-23 DIAGNOSIS — F411 Generalized anxiety disorder: Secondary | ICD-10-CM | POA: Diagnosis not present

## 2023-09-23 DIAGNOSIS — E782 Mixed hyperlipidemia: Secondary | ICD-10-CM

## 2023-09-23 DIAGNOSIS — H9319 Tinnitus, unspecified ear: Secondary | ICD-10-CM

## 2023-09-23 DIAGNOSIS — L409 Psoriasis, unspecified: Secondary | ICD-10-CM

## 2023-09-23 MED ORDER — FLUOCINONIDE 0.05 % EX SOLN: CUTANEOUS | 0 refills | Status: AC

## 2023-09-23 NOTE — Assessment & Plan Note (Signed)
No concerns presently. Continue current treatment

## 2023-09-23 NOTE — Assessment & Plan Note (Signed)

## 2023-09-23 NOTE — Assessment & Plan Note (Signed)
Labs pending. Recommend low saturated fat diet choices.

## 2023-09-23 NOTE — Assessment & Plan Note (Signed)
A1c monitored today.

## 2023-09-23 NOTE — Progress Notes (Signed)
Shawna Clamp, DNP, AGNP-c [facility] [address] [place], [place] [address] Main Office [phone]  BP 120/78   Pulse 80   Ht 5\' 2"  (1.575 m)   Wt 140 lb 12.8 oz (63.9 kg)   LMP 10/01/2016   BMI 25.75 kg/m    Subjective:    Patient ID: Renee Ochoa, female    DOB: 02-Oct-1966, 57 y.o.   MRN: [id number]  HPI: Renee Ochoa is a 57 y.o. female presenting on 09/23/2023 for comprehensive medical examination.   History of Present Illness Amandajo presented for a routine physical examination. She reported experiencing tinnitus, particularly when at the computer, for the past few months. She also noted a sensation of ringing in her ears, which she initially attributed to the buzzing of lights. The patient has a history of juvenile arthritis and reported chronic joint pain, with occasional swelling in her fingers. She also reported experiencing muscle cramps, particularly at night, which she attributed to possible dehydration.  The patient has been managing her psoriasis and noted that she can often feel a flare-up before it becomes visibly scaly, particularly on the top of her head and inside her ears. She also reported a history of allergies.   Regarding her general health, the patient reported no changes in bowel habits, no chest pain or shortness of breath, and no palpitations. She also reported no changes to her skin and no difficulty swallowing or fullness in her throat. She has been taking Claritin for her allergies.  The patient has been in a monogamous relationship for the past ten years and did not request STD testing. She also reported that she had received her COVID-19 vaccination at a local pharmacy.  The patient has a family history of Renee Ochoa-onset Alzheimer's disease, with her father being diagnosed at age 63. She expressed concern about potentially developing the disease in the future.  The patient's mood appeared to be well-controlled, although she  noted that she had been working long hours due to being short-staffed at work, which had caused some stress. However, her workload has since decreased, and she reported feeling better.  The patient is due for a colonoscopy, which has been delayed due to issues with her healthcare provider. She is also due for a pap in 2027. She reported no issues with her menstrual cycle, as she has been menopausal for several years.  Pertinent items are noted in HPI.  IMMUNIZATIONS:   Flu Vaccine: Flu vaccine completed elsewhere this season. Record updated. Prevnar 13: Prevnar 13 N/A for this patient Prevnar 20: Prevnar 20 N/A for this patient Pneumovax 23: Pneumovax 23 N/A for this patient Vac Shingrix: Shingrix both doses completed, documentation needed HPV: N/A or Aged Out Tetanus: Tetanus completed in the last 10 years COVID: COVID completed, documentation needed RSV: No  HEALTH MAINTENANCE: Pap Smear HM Status: is up to date Mammogram HM Status: is up to date Colon Cancer Screening HM Status: DUE Bone Density HM Status: N/A STI Testing HM Status: was declined  Lung CT HM Status: N/A  Concerns with vision, hearing, or dentition: Yes: tinnitus  Most Recent Depression Screen:     09/23/2023    9:40 AM  Depression screen PHQ 2/9  Decreased Interest 0  Down, Depressed, Hopeless 0  PHQ - 2 Score 0   Most Recent Anxiety Screen:      No data to display         Most Recent Fall Screen:    09/23/2023    9:40  AM  Fall Risk   Falls in the past year? 1  Number falls in past yr: 1  Comment falls when hiking,  Injury with Fall? 0  Risk for fall due to : No Fall Risks  Follow up Falls evaluation completed    Past medical history, surgical history, medications, allergies, family history and social history reviewed with patient today and changes made to appropriate areas of the chart.  Past Medical History:  Past Medical History:  Diagnosis Date   Acute right-sided low back pain  without sciatica 01/12/2023   ADD (attention deficit disorder)    Allergy 1992   Anxiety    Arthritis    Cancer (HCC)    Colon polyp    Depression    Encounter for medical examination to establish care 08/22/2022   Heart murmur    Menstrual migraine    Moderate mixed hyperlipidemia not requiring statin therapy 08/22/2022   PONV (postoperative nausea and vomiting)    Medications:  Current Outpatient Medications on File Prior to Visit  Medication Sig   COLLAGEN PO Take by mouth.   escitalopram (LEXAPRO) 10 MG tablet Take 1 tablet (10 mg total) by mouth daily.   ketoconazole (NIZORAL) 2 % shampoo Apply 1 Application topically 2 (two) times a week. Once every there week.   Loratadine (CLARITIN PO) Take by mouth.   LORazepam (ATIVAN) 0.5 MG tablet Take 1 tablet (0.5 mg total) by mouth at bedtime as needed for anxiety.   Polyethylene Glycol 3350 (MIRALAX PO) Take by mouth.   tamsulosin (FLOMAX) 0.4 MG CAPS capsule Take 1 capsule (0.4 mg total) by mouth daily. For kidney stone.   VITAMIN D PO Take by mouth.   No current facility-administered medications on file prior to visit.   Surgical History:  Past Surgical History:  Procedure Laterality Date   APPENDECTOMY  2014   BREAST LUMPECTOMY     RIGHT   HERNIA REPAIR  1974   LAPAROSCOPIC APPENDECTOMY N/A 10/23/2013   Procedure: APPENDECTOMY LAPAROSCOPIC;  Surgeon: Currie Paris, MD;  Location: [facility] OR;  Service: General;  Laterality: N/A;   RESECTOSCOPIC POLYPECTOMY, ENDOMYORESECTION, ENDOMETRIAL ABLATION  07/24/2001   UMBILICAL HERNIA REPAIR     Allergies:  Allergies  Allergen Reactions   Fexofenadine Hcl Hives   Covid-19 Mrna Vacc (Moderna) Palpitations and Swelling   Family History:  Family History  Problem Relation Age of Onset   Hypertension Father    Heart attack Father    Dementia Father    Alcohol abuse Father    Heart disease Father    Breast cancer Maternal Grandmother 41   Cancer Maternal Grandmother     Hypertension Paternal Grandfather    Heart disease Paternal Grandfather    Stroke Paternal Grandfather    Dementia Mother    Alcohol abuse Mother    Varicose Veins Mother    Ulcerative colitis Son    Asthma Son    Alcohol abuse Maternal Grandfather    Asthma Brother        Objective:    BP 120/78   Pulse 80   Ht 5\' 2"  (1.575 m)   Wt 140 lb 12.8 oz (63.9 kg)   LMP 10/01/2016   BMI 25.75 kg/m   Wt Readings from Last 3 Encounters:  09/23/23 140 lb 12.8 oz (63.9 kg)  01/12/23 142 lb 3.2 oz (64.5 kg)  08/14/22 137 lb (62.1 kg)    Physical Exam Vitals and nursing note reviewed.  Constitutional:  Appearance: Normal appearance.  HENT:     Head: Normocephalic.     Right Ear: Hearing normal. Tympanic membrane is retracted.     Left Ear: Hearing normal. Tympanic membrane is retracted.     Ears:      Comments: Tinnitus reported. Unable to lateralize.  Neurological:     Mental Status: She is alert.      Results for orders placed or performed in visit on 01/12/23  POCT URINALYSIS DIP (CLINITEK)  Result Value Ref Range   Color, UA yellow yellow   Clarity, UA clear clear   Glucose, UA negative negative mg/dL   Bilirubin, UA negative negative   Ketones, POC UA negative negative mg/dL   Spec Grav, UA <=1.610 (A) 1.010 - 1.025   Blood, UA trace-intact (A) negative   pH, UA 7.0 5.0 - 8.0   POC PROTEIN,UA negative negative, trace   Urobilinogen, UA 0.2 0.2 or 1.0 E.U./dL   Nitrite, UA Negative Negative   Leukocytes, UA Negative Negative       Assessment & Plan:   Problem List Items Addressed This Visit     History of gestational diabetes    A1c monitored today.       Relevant Orders   CBC with Differential/Platelet   CMP14+EGFR   Hemoglobin A1c   Lipid panel   Moderate mixed hyperlipidemia not requiring statin therapy    Labs pending. Recommend low saturated fat diet choices.       Relevant Orders   CBC with Differential/Platelet   CMP14+EGFR    Hemoglobin A1c   Lipid panel   Encounter for annual physical exam - Primary    CPE completed today. Review of HM activities and recommendations discussed and provided on AVS. Anticipatory guidance, diet, and exercise recommendations provided. Medications, allergies, and hx reviewed and updated as necessary. Orders placed as listed below.  Plan: - Labs ordered. Will make changes as necessary based on results.  - I will review these results and send recommendations via MyChart or a telephone call.  - F/U with CPE in 1 year or sooner for acute/chronic health needs as directed.        Relevant Orders   CBC with Differential/Platelet   CMP14+EGFR   Hemoglobin A1c   Lipid panel   Generalized anxiety disorder    No concerns presently. Continue current treatment      Relevant Orders   CBC with Differential/Platelet   CMP14+EGFR   Hemoglobin A1c   Lipid panel   Tinnitus    New onset, more noticeable when at the computer. Possible association with psoriasis in the ear and retracted eardrum, possibly due to allergies. -Refer to audiologist for further evaluation. -Prescribe steroid drops for potential psoriasis flare in the ear.       Relevant Orders   Ambulatory referral to Audiology   Psoriasis    History of psoriasis with occasional flares, including in the ear. No associated joint pain or psoriatic arthritis symptoms. -Continue current management. Use prescribed steroid drops in the ear as needed.      Relevant Medications   fluocinonide (LIDEX) 0.05 % external solution   Other Visit Diagnoses     Need for influenza vaccination       Relevant Orders   Flu vaccine trivalent PF, 6mos and older(Flulaval,Afluria,Fluarix,Fluzone) (Completed)         Follow up plan: Return in about 1 year (around 09/22/2024) for CPE.  NEXT PREVENTATIVE PHYSICAL DUE IN 1 YEAR.  PATIENT COUNSELING PROVIDED FOR  ALL ADULT PATIENTS: A well balanced diet low in saturated fats, cholesterol, and  moderation in carbohydrates.  This can be as simple as monitoring portion sizes and cutting back on sugary beverages such as soda and juice to start with.    Daily water consumption of at least 64 ounces.  Physical activity at least 180 minutes per week.  If just starting out, start 10 minutes a day and work your way up.   This can be as simple as taking the stairs instead of the elevator and walking 2-3 laps around the office  purposefully every day.   STD protection, partner selection, and regular testing if high risk.  Limited consumption of alcoholic beverages if alcohol is consumed. For men, I recommend no more than 14 alcoholic beverages per week, spread out throughout the week (max 2 per day). Avoid "binge" drinking or consuming large quantities of alcohol in one setting.  Please let me know if you feel you may need help with reduction or quitting alcohol consumption.   Avoidance of nicotine, if used. Please let me know if you feel you may need help with reduction or quitting nicotine use.   Daily mental health attention. This can be in the form of 5 minute daily meditation, prayer, journaling, yoga, reflection, etc.  Purposeful attention to your emotions and mental state can significantly improve your overall wellbeing  and  Health.  Please know that I am here to help you with all of your health care goals and am happy to work with you to find a solution that works best for you.  The greatest advice I have received with any changes in life are to take it one step at a time, that even means if all you can focus on is the next 60 seconds, then do that and celebrate your victories.  With any changes in life, you will have set backs, and that is OK. The important thing to remember is, if you have a set back, it is not a failure, it is an opportunity to try again! Screening Testing Mammogram Every 1 -2 years based on history and risk factors Starting at age 33 Pap Smear Ages 21-39  every 3 years Ages 65-65 every 5 years with HPV testing More frequent testing may be required based on results and history Colon Cancer Screening Every 1-10 years based on test performed, risk factors, and history Starting at age 29 Bone Density Screening Every 2-10 years based on history Starting at age 6 for women Recommendations for men differ based on medication usage, history, and risk factors AAA Screening One time ultrasound Men 51-70 years old who have every smoked Lung Cancer Screening Low Dose Lung CT every 12 months Age 18-80 years with a 30 pack-year smoking history who still smoke or who have quit within the last 15 years   Screening Labs Routine  Labs: Complete Blood Count (CBC), Complete Metabolic Panel (CMP), Cholesterol (Lipid Panel) Every 6-12 months based on history and medications May be recommended more frequently based on current conditions or previous results Hemoglobin A1c Lab Every 3-12 months based on history and previous results Starting at age 29 or earlier with diagnosis of diabetes, high cholesterol, BMI >26, and/or risk factors Frequent monitoring for patients with diabetes to ensure blood sugar control Thyroid Panel (TSH) Every 6 months based on history, symptoms, and risk factors May be repeated more often if on medication HIV One time testing for all patients 65 and older May be repeated more  frequently for patients with increased risk factors or exposure Hepatitis C One time testing for all patients 18 and older May be repeated more frequently for patients with increased risk factors or exposure Gonorrhea, Chlamydia Every 12 months for all sexually active persons 13-24 years Additional monitoring may be recommended for those who are considered high risk or who have symptoms Every 12 months for any woman on birth control, regardless of sexual activity PSA Men 37-100 years old with risk factors Additional screening may be recommended from age  73-69 based on risk factors, symptoms, and history  Vaccine Recommendations Tetanus Booster All adults every 10 years Flu Vaccine All patients 6 months and older every year COVID Vaccine All patients 12 years and older Initial dosing with booster May recommend additional booster based on age and health history HPV Vaccine 2 doses all patients age 1-26 Dosing may be considered for patients over 26 Shingles Vaccine (Shingrix) 2 doses all adults 55 years and older Pneumonia (Pneumovax 65) All adults 65 years and older May recommend earlier dosing based on health history One year apart from Prevnar 65 Pneumonia (Prevnar 44) All adults 65 years and older Dosed 1 year after Pneumovax 23 Pneumonia (Prevnar 20) One time alternative to the two dosing of 13 and 23 For all adults with initial dose of 23, 20 is recommended 1 year later For all adults with initial dose of 13, 23 is still recommended as second option 1 year later

## 2023-09-23 NOTE — Patient Instructions (Signed)
Tinnitus Tinnitus is when you hear a sound that there's no actual source for. It may sound like ringing in your ears or something else. The sound may be loud, soft, or somewhere in between. It can last for a few seconds or be constant for days. It can come and go. Almost everyone has tinnitus at some point. It's not the same as hearing loss. But you may need to see a health care provider if: It lasts for a long time. It comes back often. You have trouble sleeping and focusing. What are the causes? The cause of tinnitus is often unknown. In some cases, you may get it if: You're around loud noises, such as from machines or music. An object gets stuck in your ear. Earwax builds up in Landscape architect. You drink a lot of alcohol or caffeine. You take certain medicines. You start to lose your hearing. You may also get it from some medical conditions. These may include: Ear or sinus infections. Heart diseases. High blood pressure. Allergies. Mnire's disease. Problems with your thyroid. A tumor. This is a growth of cells that isn't normal. A weak, bulging blood vessel called an aneurysm near your ear. What increases the risk? You may be more likely to get tinnitus if: You're around loud noises a lot. You're older. You drink alcohol. You smoke. What are the signs or symptoms? The main symptom is hearing a sound that there's no source for. It may sound like ringing. It may also sound like: Buzzing. Sizzling. Blowing air. Hissing. Whistling. Other sounds may include: Roaring. Running water. A musical note. Tapping. Humming. You may have symptoms in one ear or both ears. How is this diagnosed? Tinnitus is diagnosed based on your symptoms, your medical history, and an exam. Your provider may do a full hearing test if your tinnitus: Is in just one ear. Makes it hard for you to hear. Lasts 6 months or longer. You may also need to see an expert in hearing disorders called an audiologist.  They may ask you about your symptoms and how tinnitus affects your daily life. You may have other tests done. These may include: A CT scan. An MRI. An angiogram. This shows how blood flows through your blood vessels. How is this treated? Treatment may include: Therapy to help you manage the stress of living with tinnitus. Finding ways to mask or cover the sound of tinnitus. These include: Sound or white noise machines. Devices that fit in your ear and play sounds or music. A loud humidifier. Acoustic neural stimulation. This is when you use headphones to listen to music that has a special signal in it. Over time, this signal may change some of the pathways in your brain. This can make you less sensitive to tinnitus. This treatment is used for very severe cases. Using hearing aids or cochlear implants if your tinnitus is from hearing loss. If your tinnitus is caused by a medical condition, treating the condition may make it go away.  Follow these instructions at home: Managing symptoms     Try to avoid being in loud places or around loud noises. Wear earplugs or headphones when you're around loud noises. Find ways to reduce stress. These may include meditation, yoga, or deep breathing. Sleep with your head slightly raised. General instructions Take over-the-counter and prescription medicines only as told by your provider. Track the things that cause symptoms (triggers). Try to avoid these things. To stop your tinnitus from getting worse: Do not drink alcohol. Do  not have caffeine. Do not use any products that contain nicotine or tobacco. These products include cigarettes, chewing tobacco, and vaping devices, such as e-cigarettes. If you need help quitting, ask your provider. Avoid using too much salt. Get enough sleep each night. Where to find more information American Tinnitus Association: https://www.johnson-hamilton.org/ Contact a health care provider if: Your symptoms last for 3 weeks or longer without  stopping. You have sudden hearing loss. Your symptoms get worse or don't get better with home care. You can't manage the stress of living with tinnitus. Get help right away if: You get tinnitus after a head injury. You have tinnitus and: Dizziness. Nausea and vomiting. Loss of balance. A sudden, severe headache. Changes to your eyesight. Weakness in your face, arms, or legs. These symptoms may be an emergency. Get help right away. Call 911. Do not wait to see if the symptoms will go away. Do not drive yourself to the hospital. This information is not intended to replace advice given to you by your health care provider. Make sure you discuss any questions you have with your health care provider. Document Revised: 02/15/2023 Document Reviewed: 02/15/2023 Elsevier Patient Education  2024 [employer].

## 2023-09-23 NOTE — Assessment & Plan Note (Signed)
History of psoriasis with occasional flares, including in the ear. No associated joint pain or psoriatic arthritis symptoms. -Continue current management. Use prescribed steroid drops in the ear as needed.

## 2023-09-23 NOTE — Assessment & Plan Note (Signed)
New onset, more noticeable when at the computer. Possible association with psoriasis in the ear and retracted eardrum, possibly due to allergies. -Refer to audiologist for further evaluation. -Prescribe steroid drops for potential psoriasis flare in the ear.

## 2023-09-24 LAB — CBC WITH DIFFERENTIAL/PLATELET
Basophils Absolute: 0.1 10*3/uL (ref 0.0–0.2)
Basos: 1 %
EOS (ABSOLUTE): 0.3 10*3/uL (ref 0.0–0.4)
Eos: 6 %
Hematocrit: 44 % (ref 34.0–46.6)
Hemoglobin: 14.7 g/dL (ref 11.1–15.9)
Immature Grans (Abs): 0 10*3/uL (ref 0.0–0.1)
Immature Granulocytes: 0 %
Lymphocytes Absolute: 1.9 10*3/uL (ref 0.7–3.1)
Lymphs: 34 %
MCH: 29.9 pg (ref 26.6–33.0)
MCHC: 33.4 g/dL (ref 31.5–35.7)
MCV: 90 fL (ref 79–97)
Monocytes Absolute: 0.5 10*3/uL (ref 0.1–0.9)
Monocytes: 9 %
Neutrophils Absolute: 2.7 10*3/uL (ref 1.4–7.0)
Neutrophils: 50 %
Platelets: 282 10*3/uL (ref 150–450)
RBC: 4.91 x10E6/uL (ref 3.77–5.28)
RDW: 13.1 % (ref 11.7–15.4)
WBC: 5.4 10*3/uL (ref 3.4–10.8)

## 2023-09-24 LAB — LIPID PANEL
Chol/HDL Ratio: 2.9 ratio (ref 0.0–4.4)
Cholesterol, Total: 221 mg/dL — ABNORMAL HIGH (ref 100–199)
HDL: 75 mg/dL (ref >39)
LDL Chol Calc (NIH): 135 mg/dL — ABNORMAL HIGH (ref 0–99)
Triglycerides: 61 mg/dL (ref 0–149)
VLDL Cholesterol Cal: 11 mg/dL (ref 5–40)

## 2023-09-24 LAB — CMP14+EGFR
ALT: 11 [IU]/L (ref 0–32)
AST: 15 [IU]/L (ref 0–40)
Albumin: 4.3 g/dL (ref 3.8–4.9)
Alkaline Phosphatase: 65 [IU]/L (ref 44–121)
BUN/Creatinine Ratio: 22 (ref 9–23)
BUN: 17 mg/dL (ref 6–24)
Bilirubin Total: 0.4 mg/dL (ref 0.0–1.2)
CO2: 26 mmol/L (ref 20–29)
Calcium: 9.8 mg/dL (ref 8.7–10.2)
Chloride: 104 mmol/L (ref 96–106)
Creatinine, Ser: 0.77 mg/dL (ref 0.57–1.00)
Globulin, Total: 1.9 g/dL (ref 1.5–4.5)
Glucose: 97 mg/dL (ref 70–99)
Potassium: 4.9 mmol/L (ref 3.5–5.2)
Sodium: 143 mmol/L (ref 134–144)
Total Protein: 6.2 g/dL (ref 6.0–8.5)
eGFR: 91 mL/min/{1.73_m2} (ref >59)

## 2023-09-24 LAB — HEMOGLOBIN A1C
Est. average glucose Bld gHb Est-mCnc: 108 mg/dL
Hgb A1c MFr Bld: 5.4 % (ref 4.8–5.6)

## 2023-10-14 ENCOUNTER — Other Ambulatory Visit: Payer: Self-pay

## 2023-10-14 ENCOUNTER — Telehealth: Payer: Self-pay | Admitting: Nurse Practitioner

## 2023-10-14 DIAGNOSIS — Z860100 Personal history of colon polyps, unspecified: Secondary | ICD-10-CM

## 2023-10-14 NOTE — Telephone Encounter (Signed)
Received a call from [employer] stating pt wanted to have her colonoscopy done at [facility] and needed an order/referral sent there. Sent message to Madelaine Bhat requesting on be entered in so I may send to [place].

## 2023-11-03 ENCOUNTER — Ambulatory Visit: Payer: BC Managed Care – PPO | Attending: Nurse Practitioner | Admitting: Audiologist

## 2023-11-03 DIAGNOSIS — H9313 Tinnitus, bilateral: Secondary | ICD-10-CM | POA: Insufficient documentation

## 2023-11-03 DIAGNOSIS — H9193 Unspecified hearing loss, bilateral: Secondary | ICD-10-CM | POA: Diagnosis not present

## 2023-11-03 NOTE — Procedures (Signed)
  [facility] [address] [place], [place]  [address] [phone]  AUDIOLOGICAL  EVALUATION  NAME: Renee Ochoa     DOB:   12-10-1965      MRN: [id number]                                                                                     DATE: 11/03/2023     REFERENT: Tollie Eth, NP STATUS: Outpatient DIAGNOSIS: Decreased Hearing    History: Renee Ochoa was seen for an audiological evaluation due to negative pressure seen by her PCP. Renee Ochoa was told her eardrums were retracted at her last well visit and wanted to get it checked. Renee Ochoa denies pain or pressure. She has intermittent buzzing in both ears. She has random helicopter sounds as well.  Renee Ochoa no history of hazardous noise exposure.  Medical history shows no risk for hearing loss.    Evaluation:  Otoscopy showed a clear view of the tympanic membranes, bilaterally Tympanometry results were consistent with normal middle ear function, no sign of retraction or negative pressure, bilaterally   Audiometric testing was completed using Conventional Audiometry techniques with insert earphones and supraural headphones. Test results are consistent with normal hearing. Speech Recognition Thresholds were obtained at 10dB HL in the right ear and at 15dB HL in the left ear. Word Recognition Testing was completed at  40dB SL and Renee Ochoa scored 100% in each ear.    Results:  The test results were reviewed with Renee Ochoa. She has normal hearing in both ears. One pitch is borderline normal, start being diligent about hearing protection. Counseled on tinnitus management.  Audiogram printed and provided to Renee Ochoa.    Recommendations: Annual audiometric testing recommended to monitor hearing loss for progression.   15 minutes spent testing and counseling on results.   If you have any questions please feel free to contact me at [phone].  Ammie Ferrier Au.D.  Audiologist   11/03/2023  8:54  AM  Cc: Tollie Eth, NP

## 2023-11-27 ENCOUNTER — Ambulatory Visit
Admission: EM | Admit: 2023-11-27 | Discharge: 2023-11-27 | Disposition: A | Discharge status: Home / Self Care | Payer: BC Managed Care – PPO

## 2023-11-27 ENCOUNTER — Encounter: Payer: Self-pay | Admitting: *Deleted

## 2023-11-27 DIAGNOSIS — M542 Cervicalgia: Secondary | ICD-10-CM

## 2023-11-27 MED ORDER — KETOROLAC TROMETHAMINE 30 MG/ML IJ SOLN: 30.0000 mg | Freq: Once | INTRAMUSCULAR | Status: DC

## 2023-11-27 MED ORDER — KETOROLAC TROMETHAMINE 30 MG/ML IJ SOLN
30.0000 mg | Freq: Once | INTRAMUSCULAR | Status: AC
  Administered 2023-11-27: 30 mg via INTRAMUSCULAR

## 2023-11-27 NOTE — ED Triage Notes (Addendum)
 Pt report having neck pain this past week, but then pt fell 4 or 5 days ago after twisting ankle while hiking, and states her neck pain became worse after that (denies head injury). Pain is at base of skull on bilat sides, and occasionally feels like it's knotting up if I turn my head quickly. Pt states she went to get a massage today, but was told they will not do due to fall.

## 2023-11-27 NOTE — ED Provider Notes (Signed)
 [facility]    CSN: [id number] Arrival date & time: 11/27/23  1045      History   Chief Complaint No chief complaint on file.   HPI Renee Ochoa is a 58 y.o. female.   Patient presents for evaluation of left-sided neck pain beginning at the base of the skull present for 7 days.  Endorses that she had a fall while hiking 3 to 4 days ago that exacerbated symptoms, denies hitting head or neck at that time but did twist ankle, no concerns regarding this today.  Pain does not radiate.  Exacerbated by quick head turns making it feel as if the neck will lock up.  Has been managing with lidocaine  and Aspercreme, Bengay and massage.  Attempted to receive professional massage but was unable to due to recent fall.  Denies numbness or tingling.  Past Medical History:  Diagnosis Date   Acute right-sided low back pain without sciatica 01/12/2023   ADD (attention deficit disorder)    Allergy 1992   Anxiety    Arthritis    Cancer (HCC)    Colon polyp    Depression    Encounter for medical examination to establish care 08/22/2022   Heart murmur    Menstrual migraine    Moderate mixed hyperlipidemia not requiring statin therapy 08/22/2022   PONV (postoperative nausea and vomiting)     Patient Active Problem List   Diagnosis Date Noted   Tinnitus 09/23/2023   Psoriasis 09/23/2023   Generalized anxiety disorder 01/12/2023   Moderate mixed hyperlipidemia not requiring statin therapy 08/22/2022   Encounter for annual physical exam 08/22/2022   Skin cancer 12/25/2019   History of gestational diabetes 03/02/2014    Past Surgical History:  Procedure Laterality Date   APPENDECTOMY  2014   BREAST LUMPECTOMY     RIGHT   HERNIA REPAIR  1974   LAPAROSCOPIC APPENDECTOMY N/A 10/23/2013   Procedure: APPENDECTOMY LAPAROSCOPIC;  Surgeon: Sherlean JINNY Laughter, MD;  Location: [facility] OR;  Service: General;  Laterality: N/A;   RESECTOSCOPIC POLYPECTOMY, ENDOMYORESECTION, ENDOMETRIAL ABLATION   07/24/2001   UMBILICAL HERNIA REPAIR      OB History     Gravida  2   Para  2   Term      Preterm      AB      Living  2      SAB      IAB      Ectopic      Multiple      Live Births               Home Medications    Prior to Admission medications   Medication Sig Start Date End Date Taking? Authorizing Provider  COLLAGEN PO Take by mouth.   Yes [provider]  escitalopram  (LEXAPRO ) 10 MG tablet Take 1 tablet (10 mg total) by mouth daily. 01/12/23  Yes Early, Sara E, NP  fluocinonide  (LIDEX ) 0.05 % external solution 2-3 drops in each ear twice a day for psoriasis flair. 09/23/23  Yes Early, Sara E, NP  Loratadine (CLARITIN PO) Take by mouth.   Yes [provider]  MAGNESIUM PO Take by mouth.   Yes [provider]  VITAMIN D  PO Take by mouth.   Yes [provider]  ketoconazole (NIZORAL) 2 % shampoo Apply 1 Application topically 2 (two) times a week. Once every there week.    [provider]  LORazepam  (ATIVAN ) 0.5 MG tablet Take 1  tablet (0.5 mg total) by mouth at bedtime as needed for anxiety. 01/12/23   Early, Sara E, NP  Polyethylene Glycol 3350 (MIRALAX PO) Take by mouth.    [provider]  tamsulosin  (FLOMAX ) 0.4 MG CAPS capsule Take 1 capsule (0.4 mg total) by mouth daily. For kidney stone. 03/02/23   Early, Camie BRAVO, NP    Family History Family History  Problem Relation Age of Onset   Dementia Mother    Alcohol abuse Mother    Varicose Veins Mother    Hypertension Father    Heart attack Father    Dementia Father    Alcohol abuse Father    Heart disease Father    Asthma Brother    Breast cancer Maternal Grandmother 48   Cancer Maternal Grandmother    Alcohol abuse Maternal Grandfather    Hypertension Paternal Grandfather    Heart disease Paternal Grandfather    Stroke Paternal Grandfather    Ulcerative colitis Son    Asthma Son     Social History Social History   Tobacco Use    Smoking status: Never   Smokeless tobacco: Never  Vaping Use   Vaping status: Never Used  Substance Use Topics   Alcohol use: Yes    Alcohol/week: 1.0 standard drink of alcohol    Types: 1 Standard drinks or equivalent per week   Drug use: No     Allergies   Fexofenadine hcl and Covid-19 mrna vacc (moderna)   Review of Systems Review of Systems   Physical Exam Triage Vital Signs ED Triage Vitals  Encounter Vitals Group     BP 11/27/23 1141 124/78     Systolic BP Percentile --      Diastolic BP Percentile --      Pulse Rate 11/27/23 1141 81     Resp 11/27/23 1141 14     Temp 11/27/23 1141 97.7 F (36.5 C)     Temp Source 11/27/23 1141 Oral     SpO2 11/27/23 1141 99 %     Weight --      Height --      Head Circumference --      Peak Flow --      Pain Score 11/27/23 1145 3     Pain Loc --      Pain Education --      Exclude from Growth Chart --    No data found.  Updated Vital Signs BP 124/78 (BP Location: Right Arm)   Pulse 81   Temp 97.7 F (36.5 C) (Oral)   Resp 14   LMP 10/01/2016   SpO2 99%   Visual Acuity Right Eye Distance:   Left Eye Distance:   Bilateral Distance:    Right Eye Near:   Left Eye Near:    Bilateral Near:     Physical Exam Constitutional:      Appearance: Normal appearance.  Eyes:     Extraocular Movements: Extraocular movements intact.  Neck:      Comments: And is present at the base of the skull extending down into the muscle, no ecchymosis swelling or deformity, rigidity or crepitus, able to complete range of motion but pain is elicited with lateral turns, 2+ bilateral carotid pulses, strength is a 5 out of 5 Pulmonary:     Effort: Pulmonary effort is normal.  Neurological:     Mental Status: She is alert and oriented to person, place, and time. Mental status is at baseline.  UC Treatments / Results  Labs (all labs ordered are listed, but only abnormal results are displayed) Labs Reviewed - No data to  display  EKG   Radiology No results found.  Procedures Procedures (including critical care time)  Medications Ordered in UC Medications  ketorolac  (TORADOL ) 30 MG/ML injection 30 mg (30 mg Intramuscular Given 11/27/23 1213)    Initial Impression / Assessment and Plan / UC Course  I have reviewed the triage vital signs and the nursing notes.  Pertinent labs & imaging results that were available during my care of the patient were reviewed by me and considered in my medical decision making (see chart for details).  Neck Pain on left side  Etiology most likely muscular low suspicion for spinal involvement, no tenderness noted on exam therefore imaging deferred, Toradol  IM given, as patient has not attempted oral medicine will begin treatment with NSAIDs and over-the-counter analgesics, discussed administration and dosing, recommended RICE, heat massage stretching with activity as tolerated, [place] referral given to Ortho but may also follow-up with urgent care if symptoms persist or worsen Final Clinical Impressions(s) / UC Diagnoses   Final diagnoses:  Neck pain on left side     Discharge Instructions      Your pain is most likely caused by irritation to the muscles.  There was no tenderness over the spine  You have been given an injection of Toradol  to help reduce inflammation and pain and ideally will see improvement within our  Take ibuprofen  600 to 800 mg every 6-8 hours consistently, may continue use of topical medicine, may also try Tylenol   You may use heating pad in 15 minute intervals as needed for additional comfort,   Begin massaging or stretching affected area daily for 10 minutes as tolerated to further loosen muscles   When sitting and lying down place pillow underneath and between knees for support  Practice good posture: head back, shoulders back, chest forward, pelvis back and weight distributed evenly on both legs  If pain persist after recommended  treatment or reoccurs if may be beneficial to follow up with orthopedic specialist for evaluation, this doctor specializes in the bones and can manage your symptoms long-term with options such as but not limited to imaging, medications or physical therapy      ED Prescriptions   None    PDMP not reviewed this encounter.   Teresa Shelba SAUNDERS, NP 11/27/23 1235

## 2023-11-27 NOTE — Discharge Instructions (Addendum)
 Your pain is most likely caused by irritation to the muscles.  There was no tenderness over the spine  You have been given an injection of Toradol  to help reduce inflammation and pain and ideally will see improvement within our  Take ibuprofen  600 to 800 mg every 6-8 hours consistently, may continue use of topical medicine, may also try Tylenol   You may use heating pad in 15 minute intervals as needed for additional comfort,   Begin massaging or stretching affected area daily for 10 minutes as tolerated to further loosen muscles   When sitting and lying down place pillow underneath and between knees for support  Practice good posture: head back, shoulders back, chest forward, pelvis back and weight distributed evenly on both legs  If pain persist after recommended treatment or reoccurs if may be beneficial to follow up with orthopedic specialist for evaluation, this doctor specializes in the bones and can manage your symptoms long-term with options such as but not limited to imaging, medications or physical therapy

## 2023-12-01 DIAGNOSIS — K635 Polyp of colon: Secondary | ICD-10-CM | POA: Diagnosis not present

## 2023-12-01 DIAGNOSIS — Z860101 Personal history of adenomatous and serrated colon polyps: Secondary | ICD-10-CM | POA: Diagnosis not present

## 2023-12-01 DIAGNOSIS — Z09 Encounter for follow-up examination after completed treatment for conditions other than malignant neoplasm: Secondary | ICD-10-CM | POA: Diagnosis not present

## 2023-12-01 DIAGNOSIS — K5730 Diverticulosis of large intestine without perforation or abscess without bleeding: Secondary | ICD-10-CM | POA: Diagnosis not present

## 2023-12-01 LAB — HM COLONOSCOPY

## 2023-12-10 DIAGNOSIS — L821 Other seborrheic keratosis: Secondary | ICD-10-CM | POA: Diagnosis not present

## 2023-12-10 DIAGNOSIS — Z85828 Personal history of other malignant neoplasm of skin: Secondary | ICD-10-CM | POA: Diagnosis not present

## 2023-12-10 DIAGNOSIS — L820 Inflamed seborrheic keratosis: Secondary | ICD-10-CM | POA: Diagnosis not present

## 2023-12-10 DIAGNOSIS — L814 Other melanin hyperpigmentation: Secondary | ICD-10-CM | POA: Diagnosis not present

## 2023-12-10 DIAGNOSIS — L578 Other skin changes due to chronic exposure to nonionizing radiation: Secondary | ICD-10-CM | POA: Diagnosis not present

## 2024-01-11 ENCOUNTER — Encounter: Payer: Self-pay | Admitting: Nurse Practitioner

## 2024-01-11 DIAGNOSIS — Z1231 Encounter for screening mammogram for malignant neoplasm of breast: Secondary | ICD-10-CM | POA: Diagnosis not present

## 2024-01-11 LAB — HM MAMMOGRAPHY

## 2024-04-10 ENCOUNTER — Ambulatory Visit: Admitting: Family Medicine

## 2024-04-10 ENCOUNTER — Encounter: Payer: Self-pay | Admitting: Family Medicine

## 2024-04-10 DIAGNOSIS — F411 Generalized anxiety disorder: Secondary | ICD-10-CM | POA: Diagnosis not present

## 2024-04-10 MED ORDER — BUPROPION HCL ER (SR) 100 MG PO TB12: 100.0000 mg | ORAL_TABLET | Freq: Every day | ORAL | 1 refills | Status: DC

## 2024-04-10 MED ORDER — ESCITALOPRAM OXALATE 10 MG PO TABS: 10.0000 mg | ORAL_TABLET | Freq: Every day | ORAL | 3 refills | Status: DC

## 2024-04-10 NOTE — Patient Instructions (Signed)
 Message on MyChart with Andra Banco in a month and let her know how you are doing

## 2024-04-10 NOTE — Progress Notes (Signed)
   Subjective:    Patient ID: Renee Ochoa, female    DOB: 11/03/1966, 58 y.o.   MRN: [id number]  HPI She is here for consult concerning possible use of HRT.  She apparently did go through menopause at age 74 and presently is taking Lexapro .  She states that it does help with some of her symptoms but still feels that she gets moody and has 1 or 2 hot flashes per day.  She states that it helps with her anxiety and depression but then describes a relatively flat affect.   Review of Systems     Objective:    Physical Exam Alert and in no distress with appropriate affect.       Assessment & Plan:  Generalized anxiety disorder - Plan: buPROPion  ER (WELLBUTRIN  SR) 100 MG 12 hr tablet, escitalopram  (LEXAPRO ) 10 MG tablet After discussion with her I think it is reasonable to add Wellbutrin  to her regimen to see if we can get rid of some of the anhedonia that she describes.  Discussed the use of HRCT and explained that I did not think that it would really help with the present symptom complex.  She is to leave a message for Andra Banco in 1 month for follow-up.

## 2024-04-23 ENCOUNTER — Encounter: Payer: Self-pay | Admitting: Nurse Practitioner

## 2024-06-18 ENCOUNTER — Other Ambulatory Visit: Payer: Self-pay | Admitting: Family Medicine

## 2024-06-18 DIAGNOSIS — F411 Generalized anxiety disorder: Secondary | ICD-10-CM

## 2024-06-22 ENCOUNTER — Encounter: Payer: Self-pay | Admitting: Nurse Practitioner

## 2024-06-22 ENCOUNTER — Ambulatory Visit (INDEPENDENT_AMBULATORY_CARE_PROVIDER_SITE_OTHER): Admitting: Nurse Practitioner

## 2024-06-22 VITALS — BP 124/82 | HR 76 | Wt 143.0 lb

## 2024-06-22 DIAGNOSIS — R002 Palpitations: Secondary | ICD-10-CM | POA: Diagnosis not present

## 2024-06-22 DIAGNOSIS — N951 Menopausal and female climacteric states: Secondary | ICD-10-CM

## 2024-06-22 DIAGNOSIS — L578 Other skin changes due to chronic exposure to nonionizing radiation: Secondary | ICD-10-CM | POA: Insufficient documentation

## 2024-06-22 DIAGNOSIS — N393 Stress incontinence (female) (male): Secondary | ICD-10-CM | POA: Diagnosis not present

## 2024-06-22 DIAGNOSIS — D225 Melanocytic nevi of trunk: Secondary | ICD-10-CM | POA: Insufficient documentation

## 2024-06-22 DIAGNOSIS — D2362 Other benign neoplasm of skin of left upper limb, including shoulder: Secondary | ICD-10-CM | POA: Insufficient documentation

## 2024-06-22 DIAGNOSIS — L814 Other melanin hyperpigmentation: Secondary | ICD-10-CM | POA: Insufficient documentation

## 2024-06-22 DIAGNOSIS — F339 Major depressive disorder, recurrent, unspecified: Secondary | ICD-10-CM

## 2024-06-22 DIAGNOSIS — L821 Other seborrheic keratosis: Secondary | ICD-10-CM | POA: Insufficient documentation

## 2024-06-22 DIAGNOSIS — Z85828 Personal history of other malignant neoplasm of skin: Secondary | ICD-10-CM | POA: Insufficient documentation

## 2024-06-22 DIAGNOSIS — D1801 Hemangioma of skin and subcutaneous tissue: Secondary | ICD-10-CM | POA: Insufficient documentation

## 2024-06-22 NOTE — Patient Instructions (Addendum)
 Try stopping the lexapro  to see how you feel off of this medication. If you start to feel bad then you can restart.    I have ordered the labs. Once we get those back, I will send in the medication options.   We will need to check labs about 3 months after we start the medication (hormones) but we can set that up once we have a plan, if you like.    Menopause and HRT (Hormone Replacement Therapy): What to Expect Menopause is the time in your life when your menstrual period stops, and your ovaries stop making certain hormones. These hormones are called estrogen and progesterone. Low levels of these hormones can affect your health and cause symptoms. Hormone replacement therapy (HRT) is a treatment that replaces the estrogen and progesterone in your body. Types of HRT  There are many types and doses of HRT. Talk with your health care provider about what form of HRT is best for you. HRT may include: Estrogen and progestin. This is more common if you have a uterus. Estrogen-only therapy. This is used if you don't have a uterus. You may be given the HRT as: Pills. Patches. Gels. Sprays. A vaginal cream. Vaginal rings. Vaginal inserts. How many hormones you need to take and how long you should take them depend on your health. You should: Start HRT at the lowest possible dose. Stop HRT as soon as you're told to. Work with your provider so you feel informed and comfortable with what happens. Tell a health care provider about: Any allergies you have. Whether you've had blood clots or are at risk for blood clots. Whether you or family members have had cancer, such as cancer of: The breasts. The ovaries. The uterus. Any surgeries you've had. Any medical problems you have. All medicines you take. These include vitamins, herbs, eye drops, and creams. Whether you're pregnant or may be pregnant. What are the benefits? HRT can help with the symptoms of menopause. The benefits depend  on: What symptoms you have. How bad your symptoms are. Your overall health. Symptoms of menopause that HRT can help with include: Hot flashes and night sweats. Hot flashes are sudden feelings of heat that spread over your face and body. Your skin may turn red, like a blush. Night sweats are hot flashes that happen when you're sleeping or trying to sleep. Bone loss. The body loses calcium faster after menopause. This can cause your bones to be weaker. They may be more likely to break. Vaginal dryness. The lining of the vagina can get thin and dry, which can cause: Pain during sex. Infection. Burning. Itching. Urinary tract infections. Not being able to control when you pee. Irritability, which means getting annoyed easily. Short-term memory problems. What are the risks? Risks depend on your health and medical history. They also depend on if you get estrogen and progestin or estrogen-only therapy. HRT may make you more likely to have: Spotting. This is when a small amount of blood leaks from your vagina when you don't expect it to. Endometrial cancer. This is cancer of the lining of the uterus. Breast cancer. Higher density of breast tissue. This can make it harder to find breast cancer on an X-ray. A stroke. Heart disease. Blood clots. Gallbladder or liver disease. You may be more at risk for problems if you have: Endometrial cancer. Liver disease. Heart disease. Breast cancer. A history of blood clots. A history of stroke. Follow these instructions at home: Take your medicines only  as told. Do not smoke, vape, or use nicotine or tobacco. Go to your provider as often as told to get: Mammograms. Pelvic exams. Medical checkups. Contact a health care provider if: Your legs hurt or swell. You feel lumps or changes in your breasts or armpits. You feel pain, burning, or bleeding when you pee. You have bleeding from your vagina that's not normal. You feel dizzy or get  headaches. You have pain in your belly. Get help right away if: You have shortness of breath. You have chest pain. You have slurred speech. Any part of your arms or legs feels weak or numb. These symptoms may be an emergency. Call 911 right away. Do not wait to see if the symptoms will go away. Do not drive yourself to the hospital. This information is not intended to replace advice given to you by your health care provider. Make sure you discuss any questions you have with your health care provider. Document Revised: 07/09/2023 Document Reviewed: 07/09/2023 Elsevier Patient Education  2024 [employer].

## 2024-06-22 NOTE — Progress Notes (Unsigned)
  Catheline Doing, DNP, AGNP-c [facility]  [address] [place], [place] [address] [phone]  ESTABLISHED PATIENT- Chronic Health and/or Follow-Up Visit  Blood pressure 124/82, pulse 76, weight 143 lb (64.9 kg), last menstrual period 10/01/2016.    Renee Ochoa is a 58 y.o. year old female presenting today for evaluation and management of chronic conditions.     All ROS negative with exception of what is listed above.   PHYSICAL EXAM Physical Exam   PLAN Problem List Items Addressed This Visit   None   No follow-ups on file.  Catheline Doing, DNP, AGNP-c

## 2024-06-23 ENCOUNTER — Ambulatory Visit: Payer: Self-pay | Admitting: Nurse Practitioner

## 2024-06-23 DIAGNOSIS — R002 Palpitations: Secondary | ICD-10-CM | POA: Insufficient documentation

## 2024-06-23 DIAGNOSIS — Z79890 Hormone replacement therapy (postmenopausal): Secondary | ICD-10-CM

## 2024-06-23 DIAGNOSIS — N393 Stress incontinence (female) (male): Secondary | ICD-10-CM | POA: Insufficient documentation

## 2024-06-23 DIAGNOSIS — F339 Major depressive disorder, recurrent, unspecified: Secondary | ICD-10-CM | POA: Insufficient documentation

## 2024-06-23 HISTORY — DX: Palpitations: R00.2

## 2024-06-23 LAB — FSH/LH
FSH: 90.6 m[IU]/mL
LH: 45.8 m[IU]/mL

## 2024-06-23 LAB — ESTRADIOL: Estradiol: <5 pg/mL

## 2024-06-23 LAB — PROGESTERONE: Progesterone: <0.1 ng/mL

## 2024-06-23 NOTE — Assessment & Plan Note (Signed)
 Palpitations occurred after starting Wellbutrin , initially frequent but now only occur in specific positions (e.g., feet elevated above head). Palpitations are not accompanied by other symptoms and have decreased in frequency. Discussed potential causes. No immediate concern but advised to monitor for changes. - Monitor for increased frequency or additional symptoms - Report any changes in palpitations

## 2024-06-23 NOTE — Assessment & Plan Note (Signed)
 Consider pelvic PT for management.

## 2024-06-23 NOTE — Assessment & Plan Note (Signed)
 Menopausal symptoms include hot flashes, sleep disturbances, and mood changes. Hot flashes have decreased from seventeen to one per day. Sleep has improved with Wellbutrin  but is still interrupted by nocturia. Mood changes may be influenced by hormonal changes. Discussed the potential impact of menopause on mood and bone health. Hormone replacement therapy (HRT) considered to address symptoms and prevent osteoporosis. Discussed risks of HRT, including hormone-receptive breast cancer, and the importance of monitoring with annual mammograms and self-breast exams. She is aware of the risks and benefits and is interested in trying HRT. - Order hormone level tests including estrogen, FSH, and LH - Provide information on HRT - Discuss HRT options (pills vs patches) after lab results - Monitor symptoms and adjust treatment as necessary

## 2024-06-23 NOTE — Assessment & Plan Note (Signed)
 Depression previously managed with Lexapro  with recent start of Wellbutrin  for ongoing anhedonia. Wellbutrin  has improved mood significantly and reduced side effects compared to Lexapro . She reports feeling more emotionally responsive. Lexapro  dose was self-reduced to 5mg  and she has had no change in her control. She is willing to discontinue Lexapro  entirely to see if her mood remains stable, which I feel is totally reasonable.She will not need to taper given that she is only at 5mg . Discussed potential for Wellbutrin  to manage symptoms alone and the option to restart Lexapro  if symptoms return. - Discontinue Lexapro  - Monitor mood and symptoms - Restart Lexapro  if depressive symptoms return

## 2024-06-28 MED ORDER — ESTRADIOL-LEVONORGESTREL 0.045-0.015 MG/DAY TD PTWK: 1.0000 | MEDICATED_PATCH | TRANSDERMAL | 12 refills | Status: AC

## 2024-09-15 DIAGNOSIS — L814 Other melanin hyperpigmentation: Secondary | ICD-10-CM | POA: Diagnosis not present

## 2024-09-15 DIAGNOSIS — L821 Other seborrheic keratosis: Secondary | ICD-10-CM | POA: Diagnosis not present

## 2024-09-15 DIAGNOSIS — Z85828 Personal history of other malignant neoplasm of skin: Secondary | ICD-10-CM | POA: Diagnosis not present

## 2024-09-15 DIAGNOSIS — L578 Other skin changes due to chronic exposure to nonionizing radiation: Secondary | ICD-10-CM | POA: Diagnosis not present

## 2024-09-15 DIAGNOSIS — D485 Neoplasm of uncertain behavior of skin: Secondary | ICD-10-CM | POA: Diagnosis not present

## 2024-09-15 DIAGNOSIS — B079 Viral wart, unspecified: Secondary | ICD-10-CM | POA: Diagnosis not present

## 2024-09-15 DIAGNOSIS — D235 Other benign neoplasm of skin of trunk: Secondary | ICD-10-CM | POA: Diagnosis not present

## 2024-09-28 ENCOUNTER — Ambulatory Visit: Payer: BC Managed Care – PPO | Admitting: Nurse Practitioner

## 2024-09-28 ENCOUNTER — Encounter: Payer: Self-pay | Admitting: Nurse Practitioner

## 2024-09-28 VITALS — BP 110/76 | HR 88 | Ht 61.0 in | Wt 144.0 lb

## 2024-09-28 DIAGNOSIS — Z0000 Encounter for general adult medical examination without abnormal findings: Secondary | ICD-10-CM

## 2024-09-28 DIAGNOSIS — F339 Major depressive disorder, recurrent, unspecified: Secondary | ICD-10-CM | POA: Diagnosis not present

## 2024-09-28 DIAGNOSIS — N393 Stress incontinence (female) (male): Secondary | ICD-10-CM

## 2024-09-28 DIAGNOSIS — F411 Generalized anxiety disorder: Secondary | ICD-10-CM

## 2024-09-28 DIAGNOSIS — E782 Mixed hyperlipidemia: Secondary | ICD-10-CM | POA: Diagnosis not present

## 2024-09-28 DIAGNOSIS — Z8632 Personal history of gestational diabetes: Secondary | ICD-10-CM

## 2024-09-28 DIAGNOSIS — Z79890 Hormone replacement therapy (postmenopausal): Secondary | ICD-10-CM

## 2024-09-28 LAB — LIPID PANEL
Chol/HDL Ratio: 3.6 ratio (ref 0.0–4.4)
Cholesterol, Total: 212 mg/dL — ABNORMAL HIGH (ref 100–199)
HDL: 59 mg/dL (ref >39)
LDL Chol Calc (NIH): 136 mg/dL — ABNORMAL HIGH (ref 0–99)
Triglycerides: 98 mg/dL (ref 0–149)
VLDL Cholesterol Cal: 17 mg/dL (ref 5–40)

## 2024-09-28 LAB — CMP14+EGFR
ALT: 18 IU/L (ref 0–32)
AST: 15 IU/L (ref 0–40)
Albumin: 4.4 g/dL (ref 3.8–4.9)
Alkaline Phosphatase: 63 IU/L (ref 49–135)
BUN/Creatinine Ratio: 14 (ref 9–23)
BUN: 12 mg/dL (ref 6–24)
Bilirubin Total: 0.5 mg/dL (ref 0.0–1.2)
CO2: 24 mmol/L (ref 20–29)
Calcium: 9.7 mg/dL (ref 8.7–10.2)
Chloride: 102 mmol/L (ref 96–106)
Creatinine, Ser: 0.85 mg/dL (ref 0.57–1.00)
Globulin, Total: 2 g/dL (ref 1.5–4.5)
Glucose: 95 mg/dL (ref 70–99)
Potassium: 4.6 mmol/L (ref 3.5–5.2)
Sodium: 140 mmol/L (ref 134–144)
Total Protein: 6.4 g/dL (ref 6.0–8.5)
eGFR: 80 mL/min/1.73 (ref >59)

## 2024-09-28 LAB — CBC WITH DIFFERENTIAL/PLATELET
Basophils Absolute: 0.1 x10E3/uL (ref 0.0–0.2)
Basos: 1 %
EOS (ABSOLUTE): 0.5 x10E3/uL — ABNORMAL HIGH (ref 0.0–0.4)
Eos: 9 %
Hematocrit: 45.3 % (ref 34.0–46.6)
Hemoglobin: 14.8 g/dL (ref 11.1–15.9)
Immature Grans (Abs): 0 x10E3/uL (ref 0.0–0.1)
Immature Granulocytes: 0 %
Lymphocytes Absolute: 1.9 x10E3/uL (ref 0.7–3.1)
Lymphs: 31 %
MCH: 29.4 pg (ref 26.6–33.0)
MCHC: 32.7 g/dL (ref 31.5–35.7)
MCV: 90 fL (ref 79–97)
Monocytes Absolute: 0.7 x10E3/uL (ref 0.1–0.9)
Monocytes: 11 %
Neutrophils Absolute: 2.9 x10E3/uL (ref 1.4–7.0)
Neutrophils: 48 %
Platelets: 285 x10E3/uL (ref 150–450)
RBC: 5.04 x10E6/uL (ref 3.77–5.28)
RDW: 12.8 % (ref 11.7–15.4)
WBC: 6.1 x10E3/uL (ref 3.4–10.8)

## 2024-09-28 LAB — HEMOGLOBIN A1C
Est. average glucose Bld gHb Est-mCnc: 105 mg/dL
Hgb A1c MFr Bld: 5.3 % (ref 4.8–5.6)

## 2024-09-28 NOTE — Progress Notes (Signed)
 Catheline Doing, DNP, AGNP-c [facility] [address] [place], [place] [address] Main Office [phone] VISIT TYPE: CPE on 09/28/2024 Today's Vitals   09/28/24 0940  BP: 110/76  Pulse: 88  Weight: 144 lb (65.3 kg)  Height: 5' 1 (1.549 m)   Body mass index is 27.21 kg/m. BP 110/76   Pulse 88   Ht 5' 1 (1.549 m)   Wt 144 lb (65.3 kg)   LMP 10/01/2016   BMI 27.21 kg/m   Subjective:    Patient ID: Renee Ochoa, female    DOB: May 05, 1966, 58 y.o.   MRN: [id number]  HPI:  History of Present Illness Renee Ochoa is a 58 year old female who presents for a routine physical exam.  She continues to use Wellbutrin  at 100 mg and an estrogen and progesterone  patch, which has significantly improved her sleep quality. She now averages six hours and forty-five minutes of sleep per night, compared to fragmented and light sleep prior to the patch. She also notes a reduction in falls.  She denies current use of lorazepam , although she has some on hand. No fullness in her throat or difficulty swallowing, but she experiences occasional choking on saliva, which she attributes to aging. No new lumps, bumps, or moles, and recent dermatological biopsies were normal.  She reports slight stress incontinence but no significant changes in bowel or bladder habits. No swelling in her feet and her joints are okay. She recalls a past episode of abdominal pain after paddling, which has since resolved.  Her son has ulcerative colitis and receives care in [place]. She is involved in volunteer work, [employer], and serves on a engineering geologist. Pertinent items are noted in HPI.  Most Recent Depression Screen:     09/28/2024    9:39 AM 09/23/2023    9:40 AM  Depression screen PHQ 2/9  Decreased Interest 0 0  Down, Depressed, Hopeless 1 0  PHQ - 2 Score 1 0  Altered sleeping 0   Tired, decreased energy 0   Change in appetite 1    Feeling bad or failure about yourself  0   Trouble concentrating 1   Moving slowly or fidgety/restless 0   Suicidal thoughts 0   PHQ-9 Score 3    Difficult doing work/chores Somewhat difficult      Data saved with a previous flowsheet row definition   Most Recent Anxiety Screen:      No data to display         Most Recent Fall Screen:    09/28/2024    9:37 AM 09/23/2023    9:40 AM  Fall Risk   Falls in the past year? 1 1  Number falls in past yr: 1 1  Comment  falls when hiking,  Injury with Fall? 0 0  Risk for fall due to : Other (Comment) No Fall Risks  Follow up Falls evaluation completed Falls evaluation completed    Past medical history, surgical history, medications, allergies, family history and social history reviewed with patient today and changes made to appropriate areas of the chart.  Past Medical History:  Past Medical History:  Diagnosis Date   Acute right-sided low back pain without sciatica 01/12/2023   ADD (attention deficit disorder)    Allergy 1992   Anxiety    Arthritis    Cancer (HCC)    Colon polyp    Depression    Encounter for medical examination to establish care  08/22/2022   Heart murmur    Menstrual migraine    Moderate mixed hyperlipidemia not requiring statin therapy 08/22/2022   Palpitations 06/23/2024   PONV (postoperative nausea and vomiting)    Medications:  Current Outpatient Medications on File Prior to Visit  Medication Sig   buPROPion  ER (WELLBUTRIN  SR) 100 MG 12 hr tablet TAKE 1 TABLET BY MOUTH DAILY   COLLAGEN PO Take by mouth.   Cranberry 250 MG CAPS Take by mouth.   estradiol -levonorgestrel  (CLIMARAPRO) 0.045-0.015 MG/DAY Place 1 patch onto the skin once a week.   fluocinonide  (LIDEX ) 0.05 % external solution 2-3 drops in each ear twice a day for psoriasis flair.   ketoconazole (NIZORAL) 2 % shampoo Apply 1 Application topically 2 (two) times a week. Once every there week.   Loratadine (CLARITIN PO) Take by mouth.    LORazepam  (ATIVAN ) 0.5 MG tablet Take 1 tablet (0.5 mg total) by mouth at bedtime as needed for anxiety.   MAGNESIUM PO Take by mouth.   VITAMIN D  PO Take by mouth.   No current facility-administered medications on file prior to visit.   Surgical History:  Past Surgical History:  Procedure Laterality Date   APPENDECTOMY  2014   BREAST LUMPECTOMY     RIGHT   HERNIA REPAIR  1974   LAPAROSCOPIC APPENDECTOMY N/A 10/23/2013   Procedure: APPENDECTOMY LAPAROSCOPIC;  Surgeon: Sherlean JINNY Laughter, MD;  Location: [facility] OR;  Service: General;  Laterality: N/A;   RESECTOSCOPIC POLYPECTOMY, ENDOMYORESECTION, ENDOMETRIAL ABLATION  07/24/2001   UMBILICAL HERNIA REPAIR     Allergies:  Allergies  Allergen Reactions   Fexofenadine Hcl Hives   Covid-19 Mrna Vacc (Moderna) Palpitations and Swelling   Family History:  Family History  Problem Relation Age of Onset   Dementia Mother    Alcohol abuse Mother    Varicose Veins Mother    Hypertension Father    Heart attack Father    Dementia Father    Alcohol abuse Father    Heart disease Father    Asthma Brother    Breast cancer Maternal Grandmother 68   Cancer Maternal Grandmother    Alcohol abuse Maternal Grandfather    Hypertension Paternal Grandfather    Heart disease Paternal Grandfather    Stroke Paternal Grandfather    Ulcerative colitis Son    Asthma Son        Objective:    BP 110/76   Pulse 88   Ht 5' 1 (1.549 m)   Wt 144 lb (65.3 kg)   LMP 10/01/2016   BMI 27.21 kg/m   Wt Readings from Last 3 Encounters:  09/28/24 144 lb (65.3 kg)  06/22/24 143 lb (64.9 kg)  04/10/24 144 lb 6.4 oz (65.5 kg)    Physical Exam Vitals and nursing note reviewed.  Constitutional:      General: She is not in acute distress.    Appearance: Normal appearance.  HENT:     Head: Normocephalic and atraumatic.     Right Ear: Hearing, tympanic membrane, ear canal and external ear normal.     Left Ear: Hearing, tympanic membrane, ear canal and  external ear normal.     Nose: Nose normal.     Right Sinus: No maxillary sinus tenderness or frontal sinus tenderness.     Left Sinus: No maxillary sinus tenderness or frontal sinus tenderness.     Mouth/Throat:     Lips: Pink.     Mouth: Mucous membranes are moist.     Pharynx: Oropharynx  is clear.  Eyes:     General: Lids are normal. Vision grossly intact.     Extraocular Movements: Extraocular movements intact.     Conjunctiva/sclera: Conjunctivae normal.     Pupils: Pupils are equal, round, and reactive to light.     Funduscopic exam:    Right eye: Red reflex present.        Left eye: Red reflex present.    Visual Fields: Right eye visual fields normal and left eye visual fields normal.  Neck:     Thyroid: No thyromegaly.     Vascular: No carotid bruit.  Cardiovascular:     Rate and Rhythm: Normal rate and regular rhythm.     Chest Wall: PMI is not displaced.     Pulses: Normal pulses.          Dorsalis pedis pulses are 2+ on the right side and 2+ on the left side.       Posterior tibial pulses are 2+ on the right side and 2+ on the left side.     Heart sounds: Normal heart sounds. No murmur heard. Pulmonary:     Effort: Pulmonary effort is normal. No respiratory distress.     Breath sounds: Normal breath sounds.  Abdominal:     General: Abdomen is flat. Bowel sounds are normal. There is no distension.     Palpations: Abdomen is soft. There is no hepatomegaly, splenomegaly or mass.     Tenderness: There is no abdominal tenderness. There is no right CVA tenderness, left CVA tenderness, guarding or rebound.  Musculoskeletal:        General: Normal range of motion.     Cervical back: Full passive range of motion without pain, normal range of motion and neck supple. No tenderness.     Right lower leg: No edema.     Left lower leg: No edema.  Feet:     Left foot:     Toenail Condition: Left toenails are normal.  Lymphadenopathy:     Cervical: No cervical adenopathy.      Upper Body:     Right upper body: No supraclavicular adenopathy.     Left upper body: No supraclavicular adenopathy.  Skin:    General: Skin is warm and dry.     Capillary Refill: Capillary refill takes less than 2 seconds.     Nails: There is no clubbing.  Neurological:     General: No focal deficit present.     Mental Status: She is alert and oriented to person, place, and time.     GCS: GCS eye subscore is 4. GCS verbal subscore is 5. GCS motor subscore is 6.     Sensory: Sensation is intact.     Motor: Motor function is intact.     Coordination: Coordination is intact.     Gait: Gait is intact.     Deep Tendon Reflexes: Reflexes are normal and symmetric.  Psychiatric:        Attention and Perception: Attention normal.        Mood and Affect: Mood normal.        Speech: Speech normal.        Behavior: Behavior normal. Behavior is cooperative.        Thought Content: Thought content normal.        Cognition and Memory: Cognition and memory normal.        Judgment: Judgment normal.      Results for orders placed or performed in visit  on 09/28/24  CBC with Differential/Platelet   Collection Time: 09/28/24 10:53 AM  Result Value Ref Range   WBC 6.1 3.4 - 10.8 x10E3/uL   RBC 5.04 3.77 - 5.28 x10E6/uL   Hemoglobin 14.8 11.1 - 15.9 g/dL   Hematocrit 54.6 65.9 - 46.6 %   MCV 90 79 - 97 fL   MCH 29.4 26.6 - 33.0 pg   MCHC 32.7 31.5 - 35.7 g/dL   RDW 87.1 88.2 - 84.5 %   Platelets 285 150 - 450 x10E3/uL   Neutrophils 48 Not Estab. %   Lymphs 31 Not Estab. %   Monocytes 11 Not Estab. %   Eos 9 Not Estab. %   Basos 1 Not Estab. %   Neutrophils Absolute 2.9 1.4 - 7.0 x10E3/uL   Lymphocytes Absolute 1.9 0.7 - 3.1 x10E3/uL   Monocytes Absolute 0.7 0.1 - 0.9 x10E3/uL   EOS (ABSOLUTE) 0.5 (H) 0.0 - 0.4 x10E3/uL   Basophils Absolute 0.1 0.0 - 0.2 x10E3/uL   Immature Granulocytes 0 Not Estab. %   Immature Grans (Abs) 0.0 0.0 - 0.1 x10E3/uL  CMP14+EGFR   Collection Time: 09/28/24  10:53 AM  Result Value Ref Range   Glucose 95 70 - 99 mg/dL   BUN 12 6 - 24 mg/dL   Creatinine, Ser 9.14 0.57 - 1.00 mg/dL   eGFR 80 >40 fO/fpw/8.26   BUN/Creatinine Ratio 14 9 - 23   Sodium 140 134 - 144 mmol/L   Potassium 4.6 3.5 - 5.2 mmol/L   Chloride 102 96 - 106 mmol/L   CO2 24 20 - 29 mmol/L   Calcium 9.7 8.7 - 10.2 mg/dL   Total Protein 6.4 6.0 - 8.5 g/dL   Albumin 4.4 3.8 - 4.9 g/dL   Globulin, Total 2.0 1.5 - 4.5 g/dL   Bilirubin Total 0.5 0.0 - 1.2 mg/dL   Alkaline Phosphatase 63 49 - 135 IU/L   AST 15 0 - 40 IU/L   ALT 18 0 - 32 IU/L  Hemoglobin A1c   Collection Time: 09/28/24 10:53 AM  Result Value Ref Range   Hgb A1c MFr Bld 5.3 4.8 - 5.6 %   Est. average glucose Bld gHb Est-mCnc 105 mg/dL  Lipid panel   Collection Time: 09/28/24 10:53 AM  Result Value Ref Range   Cholesterol, Total 212 (H) 100 - 199 mg/dL   Triglycerides 98 0 - 149 mg/dL   HDL 59 >60 mg/dL   VLDL Cholesterol Cal 17 5 - 40 mg/dL   LDL Chol Calc (NIH) 863 (H) 0 - 99 mg/dL   Chol/HDL Ratio 3.6 0.0 - 4.4 ratio  Estradiol    Collection Time: 09/28/24 10:54 AM  Result Value Ref Range   Estradiol  26.9 pg/mL       Assessment & Plan:   Problem List Items Addressed This Visit     Moderate mixed hyperlipidemia not requiring statin therapy   Routine monitoring of cholesterol levels planned. Managed with diet and exercise at this time. No changes- pending lab review.  - Ordered cholesterol levels as part of routine labs.      Relevant Orders   CBC with Differential/Platelet (Completed)   CMP14+EGFR (Completed)   Lipid panel (Completed)   Encounter for annual physical exam - Primary   CPE completed today. Review of HM activities and recommendations discussed and provided on AVS. Anticipatory guidance, diet, and exercise recommendations provided. Medications, allergies, and hx reviewed and updated as necessary. Orders placed as listed below.  Plan: - Labs ordered.  Will make changes as necessary  based on results.  - I will review these results and send recommendations via MyChart or a telephone call.  - F/U with CPE in 1 year or sooner for acute/chronic health needs as directed.        Generalized anxiety disorder   Depression and generalized anxiety disorder managed with Wellbutrin  100 mg daily. Reports improvement in mood and energy levels. Evening window of low mood noted but overall improvement. No current use of lorazepam , but has a few on hand for emergencies. Plans to return to therapy but needs a new therapist due to previous therapist's feedback. - Continue Wellbutrin  100 mg daily. - Encouraged finding a new therapist for continued mental health suppor      Stress incontinence   Reports slight stress incontinence. Discussed pelvic physical therapy as a treatment option to strengthen pelvic floor muscles and improve symptoms. - Referred to pelvic physical therapy for stress incontinence management.      Relevant Orders   Ambulatory referral to Physical Therapy   Depression, recurrent   Depression and generalized anxiety disorder managed with Wellbutrin  100 mg daily. Reports improvement in mood and energy levels. Evening window of low mood noted but overall improvement. No current use of lorazepam , but has a few on hand for emergencies. Plans to return to therapy but needs a new therapist due to previous therapist's feedback. - Continue Wellbutrin  100 mg daily. - Encouraged finding a new therapist for continued mental health support.      Hormone replacement therapy (HRT)   Hormone replacement therapy with estrogen and progesterone  patch. Reports significant improvement in sleep quality and reduction in falls, likely due to better sleep. Prefers patch over pills due to ease of use and reduced liver burden. - Continue estrogen and progesterone  patch.      Relevant Orders   Estradiol  (Completed)   History of gestational diabetes   Relevant Orders   Hemoglobin A1c  (Completed)    Assessment and Plan Assessment & Plan       Follow up plan: Return in about 1 year (around 09/28/2025) for CPE.  NEXT PREVENTATIVE PHYSICAL DUE IN 1 YEAR.  PATIENT COUNSELING PROVIDED FOR ALL ADULT PATIENTS: A well balanced diet low in saturated fats, cholesterol, and moderation in carbohydrates.  This can be as simple as monitoring portion sizes and cutting back on sugary beverages such as soda and juice to start with.    Daily water consumption of at least 64 ounces.  Physical activity at least 180 minutes per week.  If just starting out, start 10 minutes a day and work your way up.   This can be as simple as taking the stairs instead of the elevator and walking 2-3 laps around the office  purposefully every day.   STD protection, partner selection, and regular testing if high risk.  Limited consumption of alcoholic beverages if alcohol is consumed. For men, I recommend no more than 14 alcoholic beverages per week, spread out throughout the week (max 2 per day). Avoid binge drinking or consuming large quantities of alcohol in one setting.  Please let me know if you feel you may need help with reduction or quitting alcohol consumption.   Avoidance of nicotine, if used. Please let me know if you feel you may need help with reduction or quitting nicotine use.   Daily mental health attention. This can be in the form of 5 minute daily meditation, prayer, journaling, yoga, reflection, etc.  Purposeful attention to  your emotions and mental state can significantly improve your overall wellbeing  and  Health.  Please know that I am here to help you with all of your health care goals and am happy to work with you to find a solution that works best for you.  The greatest advice I have received with any changes in life are to take it one step at a time, that even means if all you can focus on is the next 60 seconds, then do that and celebrate your victories.  With any  changes in life, you will have set backs, and that is OK. The important thing to remember is, if you have a set back, it is not a failure, it is an opportunity to try again! Screening Testing Mammogram Every 1 -2 years based on history and risk factors Starting at age 19 Pap Smear Ages 21-39 every 3 years Ages 60-65 every 5 years with HPV testing More frequent testing may be required based on results and history Colon Cancer Screening Every 1-10 years based on test performed, risk factors, and history Starting at age 73 Bone Density Screening Every 2-10 years based on history Starting at age 54 for women Recommendations for men differ based on medication usage, history, and risk factors AAA Screening One time ultrasound Men 43-56 years old who have every smoked Lung Cancer Screening Low Dose Lung CT every 12 months Age 38-80 years with a 30 pack-year smoking history who still smoke or who have quit within the last 15 years   Screening Labs Routine  Labs: Complete Blood Count (CBC), Complete Metabolic Panel (CMP), Cholesterol (Lipid Panel) Every 6-12 months based on history and medications May be recommended more frequently based on current conditions or previous results Hemoglobin A1c Lab Every 3-12 months based on history and previous results Starting at age 54 or earlier with diagnosis of diabetes, high cholesterol, BMI >26, and/or risk factors Frequent monitoring for patients with diabetes to ensure blood sugar control Thyroid Panel (TSH) Every 6 months based on history, symptoms, and risk factors May be repeated more often if on medication HIV One time testing for all patients 40 and older May be repeated more frequently for patients with increased risk factors or exposure Hepatitis C One time testing for all patients 35 and older May be repeated more frequently for patients with increased risk factors or exposure Gonorrhea, Chlamydia Every 12 months for all sexually  active persons 13-24 years Additional monitoring may be recommended for those who are considered high risk or who have symptoms Every 12 months for any woman on birth control, regardless of sexual activity PSA Men 62-19 years old with risk factors Additional screening may be recommended from age 62-69 based on risk factors, symptoms, and history  Vaccine Recommendations Tetanus Booster All adults every 10 years Flu Vaccine All patients 6 months and older every year COVID Vaccine All patients 12 years and older Initial dosing with booster May recommend additional booster based on age and health history HPV Vaccine 2 doses all patients age 26-26 Dosing may be considered for patients over 26 Shingles Vaccine (Shingrix) 2 doses all adults 55 years and older Pneumonia (Pneumovax 56) All adults 65 years and older May recommend earlier dosing based on health history One year apart from Prevnar 26 Pneumonia (Prevnar 78) All adults 65 years and older Dosed 1 year after Pneumovax 23 Pneumonia (Prevnar 20) One time alternative to the two dosing of 13 and 23 For all adults with initial dose  of 23, 20 is recommended 1 year later For all adults with initial dose of 13, 23 is still recommended as second option 1 year later

## 2024-09-28 NOTE — Patient Instructions (Signed)
 You are looking fabulous today!! We will check some basic labs to make sure everything is looking good.     For all adult patients, I recommend A well balanced diet low in saturated fats, cholesterol, and moderation in carbohydrates.   This can be as simple as monitoring portion sizes and cutting back on sugary beverages such as soda and juice to start with.    Daily water consumption of at least 64 ounces.  Physical activity at least 180 minutes per week, if just starting out.   This can be as simple as taking the stairs instead of the elevator and walking 2-3 laps around the office  purposefully every day.   STD protection, partner selection, and regular testing if high risk.  Limited consumption of alcoholic beverages if alcohol is consumed.  For women, I recommend no more than 7 alcoholic beverages per week, spread out throughout the week.  Avoid binge drinking or consuming large quantities of alcohol in one setting.   Please let me know if you feel you may need help with reduction or quitting alcohol consumption.   Avoidance of nicotine, if used.  Please let me know if you feel you may need help with reduction or quitting nicotine use.   Daily mental health attention.  This can be in the form of 5 minute daily meditation, prayer, journaling, yoga, reflection, etc.   Purposeful attention to your emotions and mental state can significantly improve your overall wellbeing  and  Health.  Please know that I am here to help you with all of your health care goals and am happy to work with you to find a solution that works best for you.  The greatest advice I have received with any changes in life are to take it one step at a time, that even means if all you can focus on is the next 60 seconds, then do that and celebrate your victories.  With any changes in life, you will have set backs, and that is OK. The important thing to remember is, if you have a set back, it is not a failure, it is  an opportunity to try again!  Health Maintenance Recommendations Screening Testing Mammogram Every 1 -2 years based on history and risk factors Starting at age 16 Pap Smear Ages 21-39 every 3 years Ages 15-65 every 5 years with HPV testing More frequent testing may be required based on results and history Colon Cancer Screening Every 1-10 years based on test performed, risk factors, and history Starting at age 61 Bone Density Screening Every 2-10 years based on history Starting at age 18 for women Recommendations for men differ based on medication usage, history, and risk factors AAA Screening One time ultrasound Men 61-63 years old who have every smoked Lung Cancer Screening Low Dose Lung CT every 12 months Age 70-80 years with a 30 pack-year smoking history who still smoke or who have quit within the last 15 years  Screening Labs Routine  Labs: Complete Blood Count (CBC), Complete Metabolic Panel (CMP), Cholesterol (Lipid Panel) Every 6-12 months based on history and medications May be recommended more frequently based on current conditions or previous results Hemoglobin A1c Lab Every 3-12 months based on history and previous results Starting at age 15 or earlier with diagnosis of diabetes, high cholesterol, BMI >26, and/or risk factors Frequent monitoring for patients with diabetes to ensure blood sugar control Thyroid Panel (TSH w/ T3 & T4) Every 6 months based on history, symptoms, and risk  factors May be repeated more often if on medication HIV One time testing for all patients 47 and older May be repeated more frequently for patients with increased risk factors or exposure Hepatitis C One time testing for all patients 82 and older May be repeated more frequently for patients with increased risk factors or exposure Gonorrhea, Chlamydia Every 12 months for all sexually active persons 13-24 years Additional monitoring may be recommended for those who are considered  high risk or who have symptoms PSA Men 66-69 years old with risk factors Additional screening may be recommended from age 62-69 based on risk factors, symptoms, and history  Vaccine Recommendations Tetanus Booster All adults every 10 years Flu Vaccine All patients 6 months and older every year COVID Vaccine All patients 12 years and older Initial dosing with booster May recommend additional booster based on age and health history HPV Vaccine 2 doses all patients age 51-26 Dosing may be considered for patients over 26 Shingles Vaccine (Shingrix) 2 doses all adults 55 years and older Pneumonia (Pneumovax 23) All adults 65 years and older May recommend earlier dosing based on health history Pneumonia (Prevnar 78) All adults 65 years and older Dosed 1 year after Pneumovax 23  Additional Screening, Testing, and Vaccinations may be recommended on an individualized basis based on family history, health history, risk factors, and/or exposure.

## 2024-09-29 LAB — ESTRADIOL: Estradiol: 26.9 pg/mL

## 2024-10-03 ENCOUNTER — Ambulatory Visit: Payer: Self-pay | Admitting: Nurse Practitioner

## 2024-10-09 DIAGNOSIS — Z79890 Hormone replacement therapy (postmenopausal): Secondary | ICD-10-CM | POA: Insufficient documentation

## 2024-10-09 NOTE — Assessment & Plan Note (Signed)
 Reports slight stress incontinence. Discussed pelvic physical therapy as a treatment option to strengthen pelvic floor muscles and improve symptoms. - Referred to pelvic physical therapy for stress incontinence management.

## 2024-10-09 NOTE — Assessment & Plan Note (Signed)

## 2024-10-09 NOTE — Assessment & Plan Note (Signed)
 Hormone replacement therapy with estrogen and progesterone  patch. Reports significant improvement in sleep quality and reduction in falls, likely due to better sleep. Prefers patch over pills due to ease of use and reduced liver burden. - Continue estrogen and progesterone  patch.

## 2024-10-09 NOTE — Assessment & Plan Note (Signed)
 Depression and generalized anxiety disorder managed with Wellbutrin  100 mg daily. Reports improvement in mood and energy levels. Evening window of low mood noted but overall improvement. No current use of lorazepam , but has a few on hand for emergencies. Plans to return to therapy but needs a new therapist due to previous therapist's feedback. - Continue Wellbutrin  100 mg daily. - Encouraged finding a new therapist for continued mental health support.

## 2024-10-09 NOTE — Assessment & Plan Note (Signed)
 Routine monitoring of cholesterol levels planned. Managed with diet and exercise at this time. No changes- pending lab review.  - Ordered cholesterol levels as part of routine labs.

## 2024-10-09 NOTE — Assessment & Plan Note (Signed)
 Depression and generalized anxiety disorder managed with Wellbutrin  100 mg daily. Reports improvement in mood and energy levels. Evening window of low mood noted but overall improvement. No current use of lorazepam , but has a few on hand for emergencies. Plans to return to therapy but needs a new therapist due to previous therapist's feedback. - Continue Wellbutrin  100 mg daily. - Encouraged finding a new therapist for continued mental health suppor

## 2024-11-20 ENCOUNTER — Ambulatory Visit: Admitting: Nurse Practitioner

## 2024-11-20 ENCOUNTER — Encounter: Payer: Self-pay | Admitting: Nurse Practitioner

## 2024-11-20 VITALS — BP 110/70 | HR 101 | Wt 148.0 lb

## 2024-11-20 DIAGNOSIS — N939 Abnormal uterine and vaginal bleeding, unspecified: Secondary | ICD-10-CM

## 2024-11-20 NOTE — Progress Notes (Unsigned)
{  SEHM (Optional):34217}  Camie FORBES Doing, DNP, AGNP-c [facility] [address] [place], [place] [address] [phone]   ACUTE VISIT : Acute or New Concern Visit on 11/20/2024  Blood pressure 110/70, pulse (!) 101, weight 148 lb (67.1 kg), last menstrual period 10/01/2016.   Subjective:  Acute Visit (Last Monday- started cramping but then on Tuesday had some discharge that was light pink color and was gone by Friday. Lower back is still hurting)  ***  ROS negative except for what is listed in HPI. History, Medications, Surgery, SDOH, and Family History reviewed and updated as appropriate.  Objective:  Physical Exam Vitals and nursing note reviewed.  Constitutional:      Appearance: Normal appearance.  HENT:     Head: Normocephalic.  Eyes:     Pupils: Pupils are equal, round, and reactive to light.  Cardiovascular:     Rate and Rhythm: Normal rate and regular rhythm.     Pulses: Normal pulses.     Heart sounds: Normal heart sounds.  Pulmonary:     Effort: Pulmonary effort is normal.     Breath sounds: Normal breath sounds.  Musculoskeletal:        General: Normal range of motion.     Cervical back: Normal range of motion.  Skin:    General: Skin is warm.  Neurological:     General: No focal deficit present.     Mental Status: She is alert and oriented to person, place, and time.  Psychiatric:        Mood and Affect: Mood normal.         Assessment & Plan:   Assessment & Plan Abnormal uterine bleeding  Orders:   FSH/LH   Estradiol    Progesterone    Testosterone, Total, LC/MS/MS   US  Pelvic Complete With Transvaginal; Future    Camie FORBES Doing, DNP, AGNP-c  {SETIMEYorN (Optional):34216}

## 2024-11-20 NOTE — Patient Instructions (Addendum)
 I have ordered the labs to make sure the hormone levels are not too high. I have also ordered an ultrasound to make sure that the lining is appropriate or check for fibroids.    Uterine Bleeding After Menopause: What It Means Menopause is the end of a female's fertile years. After menopause, your ovaries can no longer release an egg. You'll no longer be able to get pregnant, and you'll no longer get a period. Any type of bleeding after menopause should be checked by a health care provider, as this is not normal. Treatment will depend on the cause of the bleeding. What can cause bleeding after menopause? Your provider may do tests to find out why you're bleeding. You may have bleeding if: You take hormones to treat the symptoms of menopause. The lining of your uterus thins as a result of low estrogen. The lining of your uterus thickens as a result of too much estrogen. You have cancer in the uterus. You have growths in the uterus, such as: Polyps. Fibroids. Follow these instructions at home: Pay attention to any changes in your symptoms. Let your provider know about them. If told by your provider: Avoid using tampons and douches. Get regular pelvic exams, including Pap tests. Take iron supplements. Change your pads regularly. Take your medicines only as told. Contact a health care provider if: You have pain in your belly. You have headaches. You have a fever or chills. You feel faint or dizzy. Get help right away if: You pass large blood clots. You have heavy bleeding that needs more than 1 pad an hour and you've never had this before. This information is not intended to replace advice given to you by your health care provider. Make sure you discuss any questions you have with your health care provider. Document Revised: 09/21/2023 Document Reviewed: 07/19/2023 Elsevier Patient Education  2025 [employer].

## 2024-11-22 ENCOUNTER — Ambulatory Visit: Payer: Self-pay | Admitting: Nurse Practitioner

## 2024-11-22 LAB — PROGESTERONE: Progesterone: 0.2 ng/mL

## 2024-11-22 LAB — FSH/LH
FSH: 41.6 m[IU]/mL
LH: 31.2 m[IU]/mL

## 2024-11-22 LAB — ESTRADIOL: Estradiol: 59 pg/mL

## 2024-11-22 LAB — TESTOSTERONE, TOTAL, LC/MS/MS: Testosterone, total: 10.7 ng/dL

## 2024-11-30 ENCOUNTER — Other Ambulatory Visit: Payer: Self-pay

## 2024-11-30 ENCOUNTER — Ambulatory Visit: Attending: Nurse Practitioner

## 2024-11-30 DIAGNOSIS — M6281 Muscle weakness (generalized): Secondary | ICD-10-CM | POA: Diagnosis present

## 2024-11-30 DIAGNOSIS — N3941 Urge incontinence: Secondary | ICD-10-CM | POA: Insufficient documentation

## 2024-11-30 DIAGNOSIS — R279 Unspecified lack of coordination: Secondary | ICD-10-CM | POA: Diagnosis present

## 2024-11-30 DIAGNOSIS — N393 Stress incontinence (female) (male): Secondary | ICD-10-CM | POA: Diagnosis present

## 2024-11-30 NOTE — Patient Instructions (Signed)
   Urge Drill  When you feel an urge to go, follow these steps to regain control: Stop what you are doing and be still Take one deep breath, directing your air into your abdomen Think an affirming thought, such as "I've got this." Do 5 quick flicks of your pelvic floor Walk with control to the bathroom to void, or delay voiding  The knack: Use this technique while coughing, laughing, sneezing, or with any activities that causes you to leak urine a little. Right before you perform one of these activities that increase pressure in the abdomen and pushes a little urine out, perform a pelvic floor muscle contraction and hold. If that does not completely stop the leaking, try tightening your thighs together in addition to performing a pelvic floor muscle contraction. Make sure you are not trying to stifle a cough, sneeze, or laugh; allow these activities in full as it will cause less pressure down into the bladder and pelvic floor muscles.  [facility] [address] [place], [place] [address] Phone # [phone] Fax [phone]

## 2024-11-30 NOTE — Progress Notes (Signed)
 " OUTPATIENT PHYSICAL THERAPY FEMALE PELVIC EVALUATION   Patient Name: Renee Ochoa MRN: [id number] DOB:August 03, 1966, 59 y.o., female Today's Date: 11/30/2024  END OF SESSION:  PT End of Session - 11/30/24 1149     Visit Number 1    Date for Recertification  05/17/25    Authorization Type BCBS    PT Start Time 1147    PT Stop Time 1225    PT Time Calculation (min) 38 min    Activity Tolerance Patient tolerated treatment well    Behavior During Therapy [facility] for tasks assessed/performed          Past Medical History:  Diagnosis Date   Acute right-sided low back pain without sciatica 01/12/2023   ADD (attention deficit disorder)    Allergy 1992   Anxiety    Arthritis    Cancer (HCC)    Colon polyp    Depression    Encounter for medical examination to establish care 08/22/2022   Heart murmur    Menstrual migraine    Moderate mixed hyperlipidemia not requiring statin therapy 08/22/2022   Palpitations 06/23/2024   PONV (postoperative nausea and vomiting)    Past Surgical History:  Procedure Laterality Date   APPENDECTOMY  2014   BREAST LUMPECTOMY     RIGHT   HERNIA REPAIR  1974   LAPAROSCOPIC APPENDECTOMY N/A 10/23/2013   Procedure: APPENDECTOMY LAPAROSCOPIC;  Surgeon: Sherlean JINNY Laughter, MD;  Location: [facility] OR;  Service: General;  Laterality: N/A;   RESECTOSCOPIC POLYPECTOMY, ENDOMYORESECTION, ENDOMETRIAL ABLATION  07/24/2001   UMBILICAL HERNIA REPAIR     Patient Active Problem List   Diagnosis Date Noted   Hormone replacement therapy (HRT) 10/09/2024   Stress incontinence 06/23/2024   Depression, recurrent 06/23/2024   Dermatofibroma of left shoulder 06/22/2024   Hemangioma of skin and subcutaneous tissue 06/22/2024   History of basal cell carcinoma (BCC) 06/22/2024   Lentigo 06/22/2024   Melanocytic nevus of trunk 06/22/2024   Other seborrheic keratosis 06/22/2024   Sun-damaged skin 06/22/2024   Tinnitus 09/23/2023   Psoriasis 09/23/2023   Generalized anxiety  disorder 01/12/2023   Moderate mixed hyperlipidemia not requiring statin therapy 08/22/2022   Encounter for annual physical exam 08/22/2022   Skin cancer 12/25/2019   Menopausal symptoms 01/15/2016   History of gestational diabetes 03/02/2014    PCP: Oris Camie BRAVO, NP   REFERRING PROVIDER: Oris Camie BRAVO, NP   REFERRING DIAG: N39.3 (ICD-10-CM) - Stress incontinence  THERAPY DIAG:  Urge incontinence  Stress incontinence (female) (female)  Muscle weakness (generalized)  Unspecified lack of coordination  Rationale for Evaluation and Treatment: Rehabilitation  ONSET DATE: 2025  SUBJECTIVE:  SUBJECTIVE STATEMENT: Pt states that she had urinary incontinence, but this has been cut in half after starting hormone replacement therapy. She was feeling very dry, but that is much better.    PAIN:  Are you having pain? No   PRECAUTIONS: None  RED FLAGS: None   WEIGHT BEARING RESTRICTIONS: No  FALLS:  Has patient fallen in last 6 months? No  OCCUPATION: practice transformation specialist   ACTIVITY LEVEL : hiking  PLOF: Independent  PATIENT GOALS: she would like to stop incontinence   PERTINENT HISTORY:  Appendectomy, umbilical hernia repair, endometrial ablation, Rt sided low back pain, anxiety, depression Sexual abuse: No  BOWEL MOVEMENT: Pain with bowel movement: No Type of bowel movement:Frequency inconsistent, may not go for days at a time and Strain yes Fully empty rectum: Yes:   Leakage: Yes: but not often and sometimes urgency will be a problem Urgency: Yes: sometimes Pads: No Fiber supplement/laxative Miralax when she is having issues   URINATION: Pain with urination: No Fully empty bladder: Yes:   Stream: Strong Urgency: Yes  Frequency: every 2 hours  Nocturia: has not  happened since getting on HRT Fluid Intake: a lot of water - at least 48oz of water a day  Leakage: Urge to void, Walking to the bathroom, Coughing, Sneezing, Laughing, and hiking - but been improving Pads: No  INTERCOURSE:  NA  PREGNANCY: Vaginal deliveries 2 Tearing Yes:   Episiotomy No C-section deliveries 0 Currently pregnant No  PROLAPSE: None   OBJECTIVE:  Note: Objective measures were completed at Evaluation unless otherwise noted.  11/30/2024 PATIENT SURVEYS:   PFIQ-7: UIQ-7 38  COGNITION: Overall cognitive status: Within functional limits for tasks assessed     SENSATION: Light touch: Appears intact   FUNCTIONAL TESTS:  Squat: rt valgus knee collapse  Single leg stance:  Rt: pelvic drop  Lt: pelvic drop Curl-up test: mild distortion  Sit-up test: 2/3    GAIT: Assistive device utilized: None Comments: WNL  POSTURE: rounded shoulders, forward head, increased lumbar lordosis, increased thoracic kyphosis, anterior pelvic tilt, and scoliosis with Lt thoracic curve and Rt lumbar curve   LUMBARAROM/PROM: WNL   PALPATION: General: WNL  Abdominal: Sternocostal angle: WNL Breathing: WNL Tenderness: tender and area of tightness midline abdomen just inferior to xiphoid process Scar tissue: NA Diastasis: minimal                External Perineal Exam: WNL                             Internal Pelvic Floor: WNL  Patient confirms identification and approves PT to assess internal pelvic floor and treatment Yes  PELVIC MMT:   MMT eval  Vaginal 2/5, 5 second hold, 5 repeat contractions   (Blank rows = not tested)        TONE: WNL  PROLAPSE: Grade 1 anterior vaginal wall laxity  TODAY'S TREATMENT:  DATE:  11/30/2024 EVAL  Neuromuscular re-education: Pt provides verbal consent for internal vaginal/rectal pelvic floor  exam. Internal vaginal pelvic floor muscle contraction training Quick flicks Long holds  Urge drill The knack   PATIENT EDUCATION:  Education details: See above Person educated: Patient Education method: Explanation, Demonstration, Tactile cues, Verbal cues, and Handouts Education comprehension: verbalized understanding  HOME EXERCISE PROGRAM: [id number]  ASSESSMENT:  CLINICAL IMPRESSION: Patient is a 59 y.o. female who was seen today for physical therapy evaluation and treatment for urinary urgency and urge/stress urinary incontinence. Exam findings notable for abnormal posture, abnormal squat, pelvic drop in single leg stance, core weakness,  tenderness and hardness in upper midline abdomen, pelvic floor muscle weakness and decreased endurance, and mild anterior vaginal wall laxity. Signs and symptoms are most consistent with pelvic floor muscle/core weakness and poor pressure management. Initial treatment consisted of pelvic floor muscle contraction training, the knack, and urge drill. She will continue to benefit from skilled PT intervention in order to decrease urinary incontinence, address impairments, and improve quality of life.   OBJECTIVE IMPAIRMENTS: decreased activity tolerance, decreased coordination, decreased endurance, decreased mobility, decreased ROM, decreased strength, increased fascial restrictions, increased muscle spasms, impaired flexibility, impaired tone, improper body mechanics, postural dysfunction, and pain.   ACTIVITY LIMITATIONS: continence  PARTICIPATION LIMITATIONS: cleaning, community activity, and exercise  PERSONAL FACTORS: 1 comorbidity: medical history are also affecting patient's functional outcome.   REHAB POTENTIAL: Good  CLINICAL DECISION MAKING: Stable/uncomplicated  EVALUATION COMPLEXITY: Low   GOALS: Goals reviewed with patient? Yes  SHORT TERM GOALS: Target date: 12/28/2024   Pt will be independent with HEP in order to improve  activity tolerance.   Baseline: Goal status: INITIAL  2.  Pt will be independent with the knack and urge suppression technique in order to improve bladder habits and decrease urinary incontinence.   Baseline:  Goal status: INITIAL  3.  Pt will be independent with use of squatty potty, relaxed toileting mechanics, and improved bowel movement techniques in order to increase ease of bowel movements and complete evacuation.    Baseline:  Goal status: INITIAL  4.  Patient will be able to perform single leg stance with good pelvic stability in order to demonstrate appropriate pelvic floor muscle and transversus abdominus strength and coordination in order to decrease urinary incontinence.   Baseline:  Goal status: INITIAL    LONG TERM GOALS: Target date: 05/17/2025  Pt will be independent with advanced HEP in order to improve activity tolerance.   Baseline:  Goal status: INITIAL  2.  Pt will not have to use a pad when hiking to decrease risk of infection  Baseline:  Goal status: INITIAL  3.  Pt will report 0 pad use a day in order to decrease risk of infection.  Baseline:  Goal status: INITIAL  4.  Pt will report no leaks with laughing, coughing, sneezing in order to improve comfort with interpersonal relationships and community activities.   Baseline:  Goal status: INITIAL  5.  Pt will be able to go 2-3 hours in between voids without urgency or incontinence in order to improve QOL and perform all functional activities with less difficulty.   Baseline:  Goal status: INITIAL  6.  Pt will improve UIQ-7 score to less than 10 in order to demonstrate less impact of urinary incontinence and urgency on functional activities.  Baseline: 38 Goal status: INITIAL  PLAN:  PT FREQUENCY: 1-2x/week  PT DURATION: 10 visits    PLANNED INTERVENTIONS: 02835- PT  Re-evaluation, 97110-Therapeutic exercises, 97530- Therapeutic activity, [id number]- Neuromuscular re-education, [phone]- Self Care,  [phone]- Manual therapy, [phone]- Gait training, 02886- Aquatic Therapy, [phone]- Electrical stimulation (unattended), [phone]- Traction (mechanical), [id number]- Ionotophoresis 4mg /ml Dexamethasone , 20560 (1-2 muscles), 20561 (3+ muscles)- Dry Needling, Patient/Family education, Balance training, Taping, Joint mobilization, Joint manipulation, Spinal manipulation, Spinal mobilization, Scar mobilization, Vestibular training, Cryotherapy, Moist heat, and Biofeedback  PLAN FOR NEXT SESSION: start core training and progressions   Josette Mares, PT, DPT1/8/20261:23 PM [facility] [address], [address] [place], [place] [address] Phone # [phone] Fax [phone]   "

## 2024-12-01 ENCOUNTER — Ambulatory Visit
Admission: RE | Admit: 2024-12-01 | Discharge: 2024-12-01 | Disposition: A | Source: Ambulatory Visit | Attending: Nurse Practitioner

## 2024-12-01 DIAGNOSIS — N939 Abnormal uterine and vaginal bleeding, unspecified: Secondary | ICD-10-CM

## 2024-12-13 ENCOUNTER — Other Ambulatory Visit: Payer: Self-pay

## 2024-12-13 ENCOUNTER — Telehealth: Payer: Self-pay | Admitting: Nurse Practitioner

## 2024-12-13 DIAGNOSIS — F411 Generalized anxiety disorder: Secondary | ICD-10-CM

## 2024-12-13 MED ORDER — BUPROPION HCL ER (SR) 100 MG PO TB12: 100.0000 mg | ORAL_TABLET | Freq: Every day | ORAL | 1 refills | Status: AC

## 2024-12-13 NOTE — Telephone Encounter (Signed)
 Arloa Prior fax [employer]  [facility] [phone]  09/14/24

## 2025-01-01 ENCOUNTER — Ambulatory Visit

## 2025-01-08 ENCOUNTER — Ambulatory Visit: Admitting: Nurse Practitioner

## 2025-01-08 ENCOUNTER — Ambulatory Visit

## 2025-01-15 ENCOUNTER — Ambulatory Visit

## 2025-01-23 ENCOUNTER — Ambulatory Visit

## 2025-10-08 ENCOUNTER — Encounter: Admitting: Nurse Practitioner
